# Patient Record
Sex: Female | Born: 1990 | Race: White | Hispanic: No | Marital: Single | Smoking: Current some day smoker
Health system: Southern US, Community
[De-identification: ages and names within clinical notes are randomized; demographics above are authoritative.]

## PROBLEM LIST (undated history)

## (undated) DIAGNOSIS — F329 Major depressive disorder, single episode, unspecified: Secondary | ICD-10-CM

## (undated) DIAGNOSIS — F1910 Other psychoactive substance abuse, uncomplicated: Secondary | ICD-10-CM

## (undated) DIAGNOSIS — D649 Anemia, unspecified: Secondary | ICD-10-CM

## (undated) DIAGNOSIS — F32A Depression, unspecified: Secondary | ICD-10-CM

## (undated) DIAGNOSIS — T7840XA Allergy, unspecified, initial encounter: Secondary | ICD-10-CM

## (undated) DIAGNOSIS — S62101A Fracture of unspecified carpal bone, right wrist, initial encounter for closed fracture: Secondary | ICD-10-CM

## (undated) DIAGNOSIS — F419 Anxiety disorder, unspecified: Secondary | ICD-10-CM

## (undated) DIAGNOSIS — J45909 Unspecified asthma, uncomplicated: Secondary | ICD-10-CM

## (undated) HISTORY — DX: Major depressive disorder, single episode, unspecified: F32.9

## (undated) HISTORY — DX: Depression, unspecified: F32.A

## (undated) HISTORY — PX: OTHER SURGICAL HISTORY: SHX169

## (undated) HISTORY — DX: Unspecified asthma, uncomplicated: J45.909

## (undated) HISTORY — DX: Anxiety disorder, unspecified: F41.9

## (undated) HISTORY — DX: Anemia, unspecified: D64.9

## (undated) HISTORY — DX: Other psychoactive substance abuse, uncomplicated: F19.10

## (undated) HISTORY — DX: Fracture of unspecified carpal bone, right wrist, initial encounter for closed fracture: S62.101A

## (undated) HISTORY — DX: Allergy, unspecified, initial encounter: T78.40XA

---

## 2009-04-24 ENCOUNTER — Emergency Department (HOSPITAL_COMMUNITY): Admission: EM | Admit: 2009-04-24 | Discharge: 2009-04-24 | Payer: Self-pay | Admitting: Emergency Medicine

## 2010-11-13 ENCOUNTER — Other Ambulatory Visit (HOSPITAL_COMMUNITY)
Admission: RE | Admit: 2010-11-13 | Discharge: 2010-11-13 | Disposition: A | Discharge status: Home / Self Care | Payer: Managed Care, Other (non HMO) | Source: Ambulatory Visit | Attending: Family Medicine | Admitting: Family Medicine

## 2010-11-13 ENCOUNTER — Other Ambulatory Visit: Payer: Self-pay | Admitting: Physician Assistant

## 2010-11-13 DIAGNOSIS — Z01419 Encounter for gynecological examination (general) (routine) without abnormal findings: Secondary | ICD-10-CM | POA: Insufficient documentation

## 2011-10-23 ENCOUNTER — Ambulatory Visit: Payer: Managed Care, Other (non HMO) | Admitting: Family Medicine

## 2011-10-23 VITALS — BP 98/62 | HR 72 | Temp 98.7°F | Resp 16 | Ht 67.0 in | Wt 135.2 lb

## 2011-10-23 DIAGNOSIS — J45909 Unspecified asthma, uncomplicated: Secondary | ICD-10-CM

## 2011-10-23 MED ORDER — ALBUTEROL SULFATE HFA 108 (90 BASE) MCG/ACT IN AERS: 2.0000 | INHALATION_SPRAY | Freq: Four times a day (QID) | RESPIRATORY_TRACT | Status: DC | PRN

## 2011-10-23 NOTE — Progress Notes (Signed)
  Subjective:    Patient ID: Cindy Hodges, female    DOB: [birth year], 21 y.o.   MRN: [id number]  HPI Cindy Hodges is a 21 y.o. female Hx of asthma.  Last ov here 09/06/10.  PFT's last ov - FVC: 104%, FEV1 -97%  Only on albuterol - last used 2 days ago.  EIB, with hx childhood.  Usually before exercise and around cats. Replaces about once per year.   Triggers: cats, exercise/running.   Review of Systems  Constitutional: Negative for fever and chills.  Respiratory: Negative for cough and chest tightness.   Cardiovascular: Positive for palpitations (episodic with proventil. ).       Objective:   Physical Exam  Constitutional: She is oriented to person, place, and time. She appears well-developed and well-nourished.  HENT:  Head: Normocephalic and atraumatic.  Right Ear: External ear normal.  Left Ear: External ear normal.  Eyes: EOM are normal. Pupils are equal, round, and reactive to light.  Neck: Normal range of motion.  Cardiovascular: Normal rate, regular rhythm, normal heart sounds and intact distal pulses.   Pulmonary/Chest: Effort normal and breath sounds normal. No respiratory distress. She has no wheezes. She has no rales. She exhibits no tenderness.  Neurological: She is alert and oriented to person, place, and time.  Skin: Skin is warm and dry.  Psychiatric: She has a normal mood and affect. Her behavior is normal.        Assessment & Plan:  Cindy Hodges is a 21 y.o. female Asthma - extrinsic.  Overall controlled on episodic use of albuterol.  Trigger avoidance discussed, including allegra or zyrtec if going to be around cats.  Consider Immunocap if other triggers to be identified, or decrease in control. #1 albuterol and 1 RF.

## 2011-10-23 NOTE — Patient Instructions (Signed)
To look up more info on your condition, go to the website [email or web address], then on patient resources - select UPTODATE. Under patient resources, select asthma Return to the clinic or go to the nearest emergency room if any of your symptoms worsen or new symptoms occur.

## 2011-11-22 ENCOUNTER — Ambulatory Visit: Payer: Managed Care, Other (non HMO) | Admitting: Emergency Medicine

## 2011-11-22 ENCOUNTER — Ambulatory Visit: Payer: Managed Care, Other (non HMO)

## 2011-11-22 VITALS — BP 115/76 | HR 62 | Temp 98.0°F | Resp 16 | Ht 66.25 in | Wt 132.4 lb

## 2011-11-22 DIAGNOSIS — M25539 Pain in unspecified wrist: Secondary | ICD-10-CM

## 2011-11-22 DIAGNOSIS — S52309A Unspecified fracture of shaft of unspecified radius, initial encounter for closed fracture: Secondary | ICD-10-CM

## 2011-11-22 NOTE — Patient Instructions (Addendum)
Wrist Fracture  Your caregiver has diagnosed you as having a fracture of the wrist. A fracture is a break in the bone or bones. A cast or splint is used to protect and keep your injured bone(s) from moving. The cast or splint will usually be on for about 5 to 6 weeks. One of the bones of the wrist (the navicular bone) often does not show up as a fracture on X-ray until later or in the healing phase. With this bone your caregiver will often cast as though it is fractured even if not seen on the X-ray.  HOME CARE INSTRUCTIONS   · To lessen the swelling, keep the injured part elevated while sitting or lying down. Keeping the injury above the level of your heart (the center of the chest) will decrease swelling and pain.  · Do not wear rings or jewelry on the injured hand or wrist.  · Apply ice to the injury for 15 to 20 minutes, 3 to 4 times per day while awake for 2 days. Put the ice in a plastic bag and place a thin towel between the bag of ice and your cast.  · If you have a plaster or fiberglass cast:  · Do not try to scratch the skin under the cast using sharp or pointed objects.  · Check the skin around the cast every day. You may put lotion on any red or sore areas.  · Keep your cast dry and clean.  · If you have a plaster splint:  · Wear the splint as directed.  · You may loosen the elastic around the splint if your fingers become numb, tingle, or turn cold or blue.  · If you have been put in a removable splint, wear and use as directed.  · Do not use powders or deodorants in or around the cast or splint.  · Do not remove padding from your cast or splint.  · Do not put pressure on any part of your cast or splint. It may break. Rest your cast or splint only on a pillow the first 24 hours until it is fully hardened.  · Gently move your fingers often, so they do not get stiff.  · Do not remove the splint unless directed by your caregiver. Casts must be removed by an orthopedist.  · Your cast or splint can be  protected during bathing with a plastic bag. Do not lower the cast or splint into water.  · Only take over-the-counter or prescription medicines for pain, discomfort, or fever as directed by your caregiver.  · Follow up with your caregiver as directed.  SEEK IMMEDIATE MEDICAL CARE IF:   · Your cast or splint gets damaged or breaks.  · Your cast or splint feels too tight or loose.  · You have increased pain, not controlled with medication.  · You have increased swelling.  · Your skin or nails below the injury turn blue or grey or feel cold or numb.  · You have trouble moving or feeling your fingers.  · You experience any burning or stinging from the cast or splint.  · There is a bad smell coming from under the cast or splint.  · New stains or fluids are coming from under the cast or splint.  · You have any new injuries while wearing the cast or splint.  Document Released: 01/01/2005 Document Revised: 03/13/2011 Document Reviewed: 10/21/2006  ExitCare® Patient Information ©2012 [employer].

## 2011-11-22 NOTE — Progress Notes (Signed)
  Subjective:    Patient ID: Cindy Hodges, female    DOB: [birth year], 21 y.o.   MRN: [id number]  HPI patient was in her usual state of health until approximately 1 AM when she fell backwards on an outstretched right wrist. She now has significant pain and swelling in her wrist. She states there is no possibility that she is pregnant. She did not suffer any other injury    Review of Systems     Objective:   Physical Exam there is significant swelling over the radial side of the wrist. There is significant swelling over the navicular bone.   [facility] reading (PRIMARY) by  Dr.Bayler Nehring there is a suspected impacted fracture of the radial styloid.       Assessment & Plan:  Probable impacted fracture of the radial styloid. She was placed in a thumb spica splint. We'll rex-ray in 10 days to

## 2011-11-24 ENCOUNTER — Telehealth: Payer: Self-pay | Admitting: Radiology

## 2011-11-24 NOTE — Telephone Encounter (Signed)
Please call patient had another radiologist as not convinced there is a fracture there. She needs to continue to wear the splint and repeat x-rays in 10 days as instructed      I have called mom and she states the bill for office visit was $1000, I wanted to discuss this with you, she is asking about this fee, do you think she was charged a fx code? If she was do you think she will be covered under [employer].

## 2011-11-25 NOTE — Telephone Encounter (Signed)
Please call patient later noted that if there's not a fracture there she will not be charged a fee for the fracture. We will not know for sure until the followup films are done. If there is no fracture present she will not get any fracture charge. She does need to have followup x-rays.

## 2011-11-25 NOTE — Telephone Encounter (Signed)
I have called her mother to advise.

## 2011-12-13 ENCOUNTER — Ambulatory Visit (INDEPENDENT_AMBULATORY_CARE_PROVIDER_SITE_OTHER): Payer: Managed Care, Other (non HMO) | Admitting: Emergency Medicine

## 2011-12-13 ENCOUNTER — Ambulatory Visit: Payer: Managed Care, Other (non HMO)

## 2011-12-13 VITALS — BP 100/70 | HR 70 | Temp 98.6°F | Resp 16 | Ht 66.0 in | Wt 129.0 lb

## 2011-12-13 DIAGNOSIS — M25539 Pain in unspecified wrist: Secondary | ICD-10-CM

## 2011-12-13 DIAGNOSIS — S63509A Unspecified sprain of unspecified wrist, initial encounter: Secondary | ICD-10-CM

## 2011-12-13 NOTE — Progress Notes (Signed)
  Subjective:    Patient ID: Cindy Hodges, female    DOB: 27-May-1990, 21 y.o.   MRN: [id number]  HPI    Review of Systems     Objective:   Physical Exam        Assessment & Plan:

## 2011-12-13 NOTE — Patient Instructions (Signed)
Otitis Media, Adult  A middle ear infection is an infection in the space behind the eardrum. The medical name for this is "otitis media." It may happen after a common cold. It is caused by a germ that starts growing in that space. You may feel swollen glands in your neck on the side of the ear infection.  HOME CARE INSTRUCTIONS   · Take your medicine as directed until it is gone, even if you feel better after the first few days.  · Only take over-the-counter or prescription medicines for pain, discomfort, or fever as directed by your caregiver.  · Occasional use of a nasal decongestant a couple times per day may help with discomfort and help the eustachian tube to drain better.  Follow up with your caregiver in 10 to 14 days or as directed, to be certain that the infection has cleared. Not keeping the appointment could result in a chronic or permanent injury, pain, hearing loss and disability. If there is any problem keeping the appointment, you must call back to this facility for assistance.  SEEK IMMEDIATE MEDICAL CARE IF:   · You are not getting better in 2 to 3 days.  · You have pain that is not controlled with medication.  · You feel worse instead of better.  · You cannot use the medication as directed.  · You develop swelling, redness or pain around the ear or stiffness in your neck.  MAKE SURE YOU:   · Understand these instructions.  · Will watch your condition.  · Will get help right away if you are not doing well or get worse.  Document Released: 12/28/2003 Document Revised: 03/13/2011 Document Reviewed: 10/29/2007  ExitCare® Patient Information ©2012 [employer].

## 2011-12-13 NOTE — Progress Notes (Signed)
  Subjective:    Patient ID: Cindy Hodges, female    DOB: 1990/08/01, 21 y.o.   MRN: [id number]  HPI patient enters for recheck of her wrist. Her wrist is doing very well she has very minimal pain    Review of Systems     Objective:   Physical Exam there is no tenderness over the wrist there is some limited range of motion but I feel this is secondary to her being immobilized for the last week  [facility] reading (PRIMARY) by  Dr. Cleta Alberts actually  No fracture        Assessment & Plan:  She was wrapped in an Ace wrap she is released to return to work regular duty. She was told about doing some exercises to help with range of motion

## 2012-01-03 ENCOUNTER — Ambulatory Visit (INDEPENDENT_AMBULATORY_CARE_PROVIDER_SITE_OTHER): Payer: Managed Care, Other (non HMO) | Admitting: Physician Assistant

## 2012-01-03 VITALS — BP 105/65 | HR 77 | Temp 98.6°F | Resp 16 | Ht 67.0 in | Wt 131.0 lb

## 2012-01-03 DIAGNOSIS — R05 Cough: Secondary | ICD-10-CM

## 2012-01-03 DIAGNOSIS — J45909 Unspecified asthma, uncomplicated: Secondary | ICD-10-CM

## 2012-01-03 DIAGNOSIS — R062 Wheezing: Secondary | ICD-10-CM

## 2012-01-03 LAB — POCT CBC
MCH, POC: 30.7 pg (ref 27–31.2)
MCHC: 31.9 g/dL (ref 31.8–35.4)
MCV: 96.4 fL (ref 80–97)
MID (cbc): 0.7 (ref 0–0.9)
POC LYMPH PERCENT: 16.2 %L (ref 10–50)
POC MID %: 7.9 %M (ref 0–12)
Platelet Count, POC: 235 10*3/uL (ref 142–424)
RDW, POC: 12.9 %
WBC: 8.9 10*3/uL (ref 4.6–10.2)

## 2012-01-03 MED ORDER — ALBUTEROL SULFATE (2.5 MG/3ML) 0.083% IN NEBU
2.5000 mg | INHALATION_SOLUTION | Freq: Once | RESPIRATORY_TRACT | Status: AC
  Administered 2012-01-03: 2.5 mg via RESPIRATORY_TRACT

## 2012-01-03 MED ORDER — HYDROCODONE-HOMATROPINE 5-1.5 MG/5ML PO SYRP: ORAL_SOLUTION | ORAL | Status: DC

## 2012-01-03 MED ORDER — AZITHROMYCIN 500 MG PO TABS: 500.0000 mg | ORAL_TABLET | Freq: Every day | ORAL | Status: DC

## 2012-01-03 MED ORDER — PREDNISONE 20 MG PO TABS: ORAL_TABLET | ORAL | Status: DC

## 2012-01-03 NOTE — Progress Notes (Signed)
Patient ID: Cindy Hodges MRN: [id number], DOB: July 09, 1990, 21 y.o. Date of Encounter: 01/03/2012, 5:50 PM  Primary Physician: Tally Due, MD  Chief Complaint:  Chief Complaint  Patient presents with  . Sore Throat    x 5 days  . Nasal Congestion    x 5 days  . Cough    x 5 days    HPI: 21 y.o. year old female presents with a five day history of nasal congestion, post nasal drip, sore throat, and cough. Mild sinus pressure. Afebrile. No chills. Nasal congestion thick and green/yellow. Cough is productive of green/yellow sputum and worse during the day. She has noticed that her asthma has worsened over the course of her above illness. She is now using her albuterol inhaler 1-2 times daily, when she typically uses it 1 time every 2 weeks. She does note some wheezing and shortness of breath with her cough. Ears feel full, leading to sensation of muffled hearing during the past couple of days. Has tried OTC cold preps without success. No GI complaints. Appetite normal. Works in [employer]. She does smoke tobacco occassionally.   No sick contacts, recent antibiotics, or recent travels.   No leg trauma, sedentary periods, or h/o cancer.  Asthma typically very well controlled. No issues with wrist.  Past Medical History  Diagnosis Date  . Asthma   . Allergy      Home Meds: Prior to Admission medications   Medication Sig Start Date End Date Taking? Authorizing Provider  albuterol (PROVENTIL HFA) 108 (90 BASE) MCG/ACT inhaler Inhale 2 puffs into the lungs every 6 (six) hours as needed for wheezing or shortness of breath. 10/23/11  Yes Shade Flood, MD    Allergies: No Known Allergies  History   Social History  . Marital Status: Single    Spouse Name: N/A    Number of Children: N/A  . Years of Education: N/A   Occupational History  . Not on file.   Social History Main Topics  . Smoking status: Current Some Day Smoker -- 4 years    Types: Cigarettes  .  Smokeless tobacco: Not on file  . Alcohol Use: Not on file  . Drug Use: Not on file  . Sexually Active: Not on file   Other Topics Concern  . Not on file   Social History Narrative  . No narrative on file     Review of Systems: Constitutional: negative for chills, fever, night sweats or weight changes Cardiovascular: negative for chest pain or palpitations Respiratory: negative for hemoptysis Abdominal: negative for abdominal pain, nausea, vomiting or diarrhea Dermatological: negative for rash Neurologic: negative for headache   Physical Exam: Blood pressure 105/65, pulse 77, temperature 98.6 F (37 C), resp. rate 16, height 5\' 7"  (1.702 m), weight 131 lb (59.421 kg), last menstrual period 12/03/2011, SpO2 98.00%., Body mass index is 20.52 kg/(m^2). General: Well developed, well nourished, in no acute distress. Head: Normocephalic, atraumatic, eyes without discharge, sclera non-icteric, nares are congested. Bilateral auditory canals clear, TM's are without perforation, pearly grey with reflective cone of light bilaterally. No sinus TTP. Oral cavity moist, dentition normal. Posterior pharynx with post nasal drip and mild erythema. No peritonsillar abscess or tonsillar exudate. Neck: Supple. No thyromegaly. Full ROM. No lymphadenopathy. Lungs: Mild expiratory wheezing bilaterally without rales or rhonchi. Breathing is unlabored. Status post albuterol neb: patient feels that her breathing is better. No further wheezing to auscultation.  Heart: RRR with S1 S2. No murmurs, rubs,  or gallops appreciated. Msk:  Strength and tone normal for age. Extremities: No clubbing or cyanosis. No edema. Neuro: Alert and oriented X 3. Moves all extremities spontaneously. CNII-XII grossly in tact. Psych:  Responds to questions appropriately with a normal affect.   Labs: Results for orders placed in visit on 01/03/12  POCT CBC      Component Value Range   WBC 8.9  4.6 - 10.2 K/uL   Lymph, poc 1.4   0.6 - 3.4   POC LYMPH PERCENT 16.2  10 - 50 %L   MID (cbc) 0.7  0 - 0.9   POC MID % 7.9  0 - 12 %M   POC Granulocyte 6.8  2 - 6.9   Granulocyte percent 75.9  37 - 80 %G   RBC 4.59  4.04 - 5.48 M/uL   Hemoglobin 14.1  12.2 - 16.2 g/dL   HCT, POC 19.1  47.8 - 47.9 %   MCV 96.4  80 - 97 fL   MCH, POC 30.7  27 - 31.2 pg   MCHC 31.9  31.8 - 35.4 g/dL   RDW, POC 29.5     Platelet Count, POC 235  142 - 424 K/uL   MPV 8.1  0 - 99.8 fL     ASSESSMENT AND PLAN:  21 y.o. year old female with asthmatic bronchitis, wheezing, coughing, and tobacco abuse.  -Albuterol nebulizer in office -Azithromycin 500 mg 1 po daily #5 no RF -Prednisone 20 mg #18 3x3, 2x3, 1x3 no RF -Hycodan #4oz 1 tsp po q 4-6 hours prn cough no RF SED -Mucinex -Tylenol/Motrin prn -Must stop smoking tobacco, she is an asthmatic -Does not need a refill of her albuterol inhaler -OOW today and 01/04/12 -Rest/fluids -RTC precautions -RTC 3-5 days if no improvement  Signed, Eula Listen, PA-C 01/03/2012 5:50 PM

## 2012-04-12 ENCOUNTER — Telehealth: Payer: Self-pay

## 2012-04-12 ENCOUNTER — Ambulatory Visit: Payer: Managed Care, Other (non HMO)

## 2012-04-12 NOTE — Telephone Encounter (Signed)
pts mother called and states daughter was seen on 11/22/11 and was billed a fracture of the wrist, but received a call later from our office indicating there was not a fracture, it was only a sprain.  We have billed as fracture, does this need to be corrected?

## 2012-04-12 NOTE — Telephone Encounter (Signed)
Does this matter?

## 2012-04-12 NOTE — Telephone Encounter (Signed)
I believe so, and then the follow-up visit would be billed as a 99213 (I think), rather than 99024.

## 2012-04-13 NOTE — Telephone Encounter (Signed)
Per note from [place], looks like August charges need to be changed. Forwarding to [employer])

## 2012-06-16 ENCOUNTER — Encounter (HOSPITAL_COMMUNITY): Payer: Self-pay | Admitting: *Deleted

## 2012-06-16 ENCOUNTER — Emergency Department (HOSPITAL_COMMUNITY)
Admission: EM | Admit: 2012-06-16 | Discharge: 2012-06-16 | Disposition: A | Discharge status: Home / Self Care | Payer: Managed Care, Other (non HMO) | Attending: Emergency Medicine | Admitting: Emergency Medicine

## 2012-06-16 DIAGNOSIS — Y9239 Other specified sports and athletic area as the place of occurrence of the external cause: Secondary | ICD-10-CM | POA: Insufficient documentation

## 2012-06-16 DIAGNOSIS — Z8659 Personal history of other mental and behavioral disorders: Secondary | ICD-10-CM | POA: Insufficient documentation

## 2012-06-16 DIAGNOSIS — Y92838 Other recreation area as the place of occurrence of the external cause: Secondary | ICD-10-CM | POA: Insufficient documentation

## 2012-06-16 DIAGNOSIS — IMO0002 Reserved for concepts with insufficient information to code with codable children: Secondary | ICD-10-CM

## 2012-06-16 DIAGNOSIS — W1809XA Striking against other object with subsequent fall, initial encounter: Secondary | ICD-10-CM | POA: Insufficient documentation

## 2012-06-16 DIAGNOSIS — S058X9A Other injuries of unspecified eye and orbit, initial encounter: Secondary | ICD-10-CM | POA: Insufficient documentation

## 2012-06-16 DIAGNOSIS — Z79899 Other long term (current) drug therapy: Secondary | ICD-10-CM | POA: Insufficient documentation

## 2012-06-16 DIAGNOSIS — F17200 Nicotine dependence, unspecified, uncomplicated: Secondary | ICD-10-CM | POA: Insufficient documentation

## 2012-06-16 DIAGNOSIS — J45909 Unspecified asthma, uncomplicated: Secondary | ICD-10-CM | POA: Insufficient documentation

## 2012-06-16 DIAGNOSIS — Z23 Encounter for immunization: Secondary | ICD-10-CM | POA: Insufficient documentation

## 2012-06-16 DIAGNOSIS — Y9331 Activity, mountain climbing, rock climbing and wall climbing: Secondary | ICD-10-CM | POA: Insufficient documentation

## 2012-06-16 DIAGNOSIS — Z862 Personal history of diseases of the blood and blood-forming organs and certain disorders involving the immune mechanism: Secondary | ICD-10-CM | POA: Insufficient documentation

## 2012-06-16 MED ORDER — TETANUS-DIPHTH-ACELL PERTUSSIS 5-2.5-18.5 LF-MCG/0.5 IM SUSP
0.5000 mL | Freq: Once | INTRAMUSCULAR | Status: AC
  Administered 2012-06-16: 0.5 mL via INTRAMUSCULAR
  Filled 2012-06-16: qty 0.5

## 2012-06-16 MED ORDER — CEPHALEXIN 500 MG PO CAPS: 500.0000 mg | ORAL_CAPSULE | Freq: Four times a day (QID) | ORAL | Status: DC

## 2012-06-16 MED ORDER — TETRACAINE HCL 0.5 % OP SOLN
2.0000 [drp] | Freq: Once | OPHTHALMIC | Status: DC
  Filled 2012-06-16: qty 2

## 2012-06-16 MED ORDER — FLUORESCEIN SODIUM 1 MG OP STRP
1.0000 | ORAL_STRIP | Freq: Once | OPHTHALMIC | Status: DC
  Filled 2012-06-16 (×2): qty 1

## 2012-06-16 NOTE — ED Provider Notes (Signed)
History     CSN: [id number]  Arrival date & time 06/16/12  1059   First MD Initiated Contact with Patient 06/16/12 1129      Chief Complaint  Patient presents with  . Eye Injury    (Consider location/radiation/quality/duration/timing/severity/associated sxs/prior treatment) HPI Comments: This is a 22 year old female, no pertinent past medical history, who presents emergency department with chief complaint of right eye lid laceration. Patient states that she fell and hit her face on a rock while camping. She states that her pain is mild to moderate at rest. She has not tried anything to alleviate her symptoms. She denies any loss of consciousness. She states that nothing makes her symptoms better or worse.  The history is provided by the patient. No language interpreter was used.    Past Medical History  Diagnosis Date  . Asthma   . Allergy   . Anemia   . Anxiety   . Depression   . Substance abuse     History reviewed. No pertinent past surgical history.  Family History  Problem Relation Age of Onset  . Diabetes Mother     History  Substance Use Topics  . Smoking status: Current Some Day Smoker -- 0.50 packs/day for 4 years    Types: Cigarettes  . Smokeless tobacco: Not on file  . Alcohol Use: 0.0 oz/week    2-3 Cans of beer per week     Comment: 4-7 days a week    OB History   Grav Para Term Preterm Abortions TAB SAB Ect Mult Living                  Review of Systems  All other systems reviewed and are negative.    Allergies  Review of patient's allergies indicates no known allergies.  Home Medications   Current Outpatient Rx  Name  Route  Sig  Dispense  Refill  . albuterol (PROVENTIL HFA) 108 (90 BASE) MCG/ACT inhaler   Inhalation   Inhale 2 puffs into the lungs every 6 (six) hours as needed for wheezing or shortness of breath.   18 g   1   . cephALEXin (KEFLEX) 500 MG capsule   Oral   Take 1 capsule (500 mg total) by mouth 4 (four) times  daily.   40 capsule   0     BP 110/69  Temp(Src) 98.7 F (37.1 C) (Oral)  Resp 18  SpO2 100%  Physical Exam  Nursing note and vitals reviewed. Constitutional: She is oriented to person, place, and time. She appears well-developed and well-nourished.  HENT:  Head: Normocephalic and atraumatic.  No bony tenderness to palpation over the orbits bilaterally, no facial bone tenderness to palpation  Eyes: Conjunctivae and EOM are normal. Pupils are equal, round, and reactive to light.  Right eye does not have any abrasions or lacerations, as witnessed with Wood's lamp examination, no visible foreign bodies, her vision is 20/30 bilaterally, 20/30 in the left, 20/50 in right, no pain with eye movement, no signs of orbital fracture or hyphema  Neck: Normal range of motion. Neck supple.  Cardiovascular: Normal rate and regular rhythm.  Exam reveals no gallop and no friction rub.   No murmur heard. Pulmonary/Chest: Effort normal and breath sounds normal. No respiratory distress. She has no wheezes. She has no rales. She exhibits no tenderness.  Abdominal: Soft. Bowel sounds are normal. She exhibits no distension and no mass. There is no tenderness. There is no rebound and no guarding.  Musculoskeletal: Normal range of motion. She exhibits no edema and no tenderness.  Neurological: She is alert and oriented to person, place, and time.  Skin: Skin is warm and dry.  Laceration to the right lower eyelid, moderate swelling below the right eye  Psychiatric: She has a normal mood and affect. Her behavior is normal. Judgment and thought content normal.    ED Course  Procedures (including critical care time)  Labs Reviewed - No data to display No results found.  LACERATION REPAIR Performed by: Roxy Horseman Authorized by: Roxy Horseman Consent: Verbal consent obtained. Risks and benefits: risks, benefits and alternatives were discussed Consent given by: patient Patient identity  confirmed: provided demographic data Prepped and Draped in normal sterile fashion Wound explored  Laceration Location: Right lower eyelid Laceration Length: 4 cm  No Foreign Bodies seen or palpated  Anesthesia: local infiltration  Local anesthetic: lidocaine 2 % without epinephrine  Anesthetic total: 2 ml  Irrigation method: syringe Amount of cleaning: standard  Skin closure: 5-0 monofilament   Number of sutures: 4  Technique: Simple interrupted   Tetanus updated Patient tolerance: Patient tolerated the procedure well with no immediate complications.  1. Laceration       MDM   This is a 22 year old female with right eyelid laceration, no tenderness over the facial bones, no bony tenderness or obvious deformities, doubt any involvement of the orbit, patient is able to move eyes without pain, vision is appropriate, as patient tells me that she has bad eyes any way, however her vision was pretty good here 20/30 bilaterally, 20/30 left, 20/50 right , suspect a right was worsened secondary to swelling, no evidence of corneal abrasions or lacerations, no foreign bodies. I repaired the eyelid laceration, and will have the patient followup in 5 days for suture removal.       Roxy Horseman, PA-C 06/16/12 1555

## 2012-06-16 NOTE — ED Notes (Signed)
Pt reports falling into a fire pit last night.  States that she scratched her (R) eye when she did.  Denies LOC.  Pt noted to have a swollen eye, drainage noted from eye.  Pt does report blurred vision-unsure if it is from tears or from injury.

## 2012-06-16 NOTE — ED Notes (Signed)
Right eye is red and swollen with a moderate amt of clear/yellow drainage. Pt can see out of eye, states vision is blurry. Pupils are reactive, brisk, and round.

## 2012-06-18 NOTE — ED Provider Notes (Signed)
Medical screening examination/treatment/procedure(s) were performed by non-physician practitioner and as supervising physician I was immediately available for consultation/collaboration.   Alan Davidson III, MD 06/18/12 1129 

## 2012-06-23 ENCOUNTER — Encounter (HOSPITAL_COMMUNITY): Payer: Self-pay | Admitting: Emergency Medicine

## 2012-06-23 ENCOUNTER — Emergency Department (HOSPITAL_COMMUNITY)
Admission: EM | Admit: 2012-06-23 | Discharge: 2012-06-23 | Disposition: A | Discharge status: Home / Self Care | Payer: Managed Care, Other (non HMO) | Attending: Emergency Medicine | Admitting: Emergency Medicine

## 2012-06-23 DIAGNOSIS — J45909 Unspecified asthma, uncomplicated: Secondary | ICD-10-CM | POA: Insufficient documentation

## 2012-06-23 DIAGNOSIS — Z862 Personal history of diseases of the blood and blood-forming organs and certain disorders involving the immune mechanism: Secondary | ICD-10-CM | POA: Insufficient documentation

## 2012-06-23 DIAGNOSIS — S0181XA Laceration without foreign body of other part of head, initial encounter: Secondary | ICD-10-CM

## 2012-06-23 DIAGNOSIS — Z4802 Encounter for removal of sutures: Secondary | ICD-10-CM

## 2012-06-23 DIAGNOSIS — F17200 Nicotine dependence, unspecified, uncomplicated: Secondary | ICD-10-CM | POA: Insufficient documentation

## 2012-06-23 DIAGNOSIS — Z79899 Other long term (current) drug therapy: Secondary | ICD-10-CM | POA: Insufficient documentation

## 2012-06-23 DIAGNOSIS — Z8659 Personal history of other mental and behavioral disorders: Secondary | ICD-10-CM | POA: Insufficient documentation

## 2012-06-23 NOTE — ED Notes (Addendum)
Pt here for suture removal- around R eye.  No complaints.  States sutures have been in for 1 week.

## 2012-06-23 NOTE — ED Provider Notes (Signed)
History     CSN: [id number]  Arrival date & time 06/23/12  1737   First MD Initiated Contact with Patient 06/23/12 1909      Chief Complaint  Patient presents with  . Suture / Staple Removal    (Consider location/radiation/quality/duration/timing/severity/associated sxs/prior treatment) Patient is a 22 y.o. female presenting with suture removal. The history is provided by the patient.  Suture / Staple Removal  The sutures were placed 7 to 10 days ago. Treatments since wound repair include oral antibiotics. Fever duration: none. There has been no drainage from the wound. There is no redness present. There is no swelling present. The pain has no pain. Difficulty Moving Extremity/Digit: none.    Past Medical History  Diagnosis Date  . Asthma   . Allergy   . Anemia   . Anxiety   . Depression   . Substance abuse     History reviewed. No pertinent past surgical history.  Family History  Problem Relation Age of Onset  . Diabetes Mother     History  Substance Use Topics  . Smoking status: Current Some Day Smoker -- 0.50 packs/day for 4 years    Types: Cigarettes  . Smokeless tobacco: Not on file  . Alcohol Use: 0.0 oz/week    2-3 Cans of beer per week     Comment: 4-7 days a week    OB History   Grav Para Term Preterm Abortions TAB SAB Ect Mult Living                  Review of Systems  All other systems reviewed and are negative.    Allergies  Review of patient's allergies indicates no known allergies.  Home Medications   Current Outpatient Rx  Name  Route  Sig  Dispense  Refill  . albuterol (PROVENTIL HFA) 108 (90 BASE) MCG/ACT inhaler   Inhalation   Inhale 2 puffs into the lungs every 6 (six) hours as needed for wheezing or shortness of breath.   18 g   1   . cephALEXin (KEFLEX) 500 MG capsule   Oral   Take 1 capsule (500 mg total) by mouth 4 (four) times daily.   40 capsule   0     BP 114/67  Pulse 67  Temp(Src) 98.1 F (36.7 C) (Oral)   Resp 16  SpO2 100%  LMP 06/16/2012  Physical Exam  Constitutional: She is oriented to person, place, and time. She appears well-developed and well-nourished.  HENT:  Head: Normocephalic.  Right Ear: External ear normal.  Left Ear: External ear normal.  Nose: Nose normal.  Mouth/Throat: Oropharynx is clear and moist.  Eyes: EOM are normal. Pupils are equal, round, and reactive to light. Right eye exhibits no discharge. Left eye exhibits no discharge.  Well-healed laceration without induration fluctuance or tenderness located inferior lateral  To orbit and inferior to orbit  Neck: Normal range of motion. Neck supple. No tracheal deviation present.  No nuchal rigidity no meningeal signs  Cardiovascular: Normal rate and regular rhythm.   Pulmonary/Chest: Effort normal and breath sounds normal. No stridor. No respiratory distress. She has no wheezes. She has no rales.  Abdominal: Soft. She exhibits no distension and no mass. There is no tenderness. There is no rebound and no guarding.  Musculoskeletal: Normal range of motion. She exhibits no edema and no tenderness.  Neurological: She is alert and oriented to person, place, and time. She has normal reflexes. No cranial nerve deficit. Coordination normal.  Skin: Skin is warm. No rash noted. She is not diaphoretic. No erythema. No pallor.  No pettechia no purpura    ED Course  Procedures (including critical care time)  Labs Reviewed - No data to display No results found.   1. Visit for suture removal   2. Laceration of periorbital area, initial encounter       MDM  Suture repair per note without issue. No history of fever induration fluctuance tenderness drainage or streaking erythema to suggest infection I will discharge home with supportive care family and patient agree with plan     SUTURE REMOVAL Performed by: Arley Phenix  Consent: Verbal consent obtained. Patient identity confirmed: provided demographic data Time  out: Immediately prior to procedure a "time out" was called to verify the correct patient, procedure, equipment, support staff and site/side marked as required.  Location details: periorbital area  Wound Appearance: clean  Sutures/Staples Removed: 4  Facility: sutures placed in this facility Patient tolerance: Patient tolerated the procedure well with no immediate complications.    Arley Phenix, MD 06/23/12 [facility] [place]

## 2012-12-27 ENCOUNTER — Ambulatory Visit (INDEPENDENT_AMBULATORY_CARE_PROVIDER_SITE_OTHER): Payer: Managed Care, Other (non HMO) | Admitting: Family Medicine

## 2012-12-27 ENCOUNTER — Ambulatory Visit: Payer: Managed Care, Other (non HMO)

## 2012-12-27 VITALS — BP 102/60 | HR 68 | Temp 98.7°F | Resp 18 | Ht 67.0 in | Wt 136.0 lb

## 2012-12-27 DIAGNOSIS — S93609A Unspecified sprain of unspecified foot, initial encounter: Secondary | ICD-10-CM

## 2012-12-27 DIAGNOSIS — M25572 Pain in left ankle and joints of left foot: Secondary | ICD-10-CM

## 2012-12-27 DIAGNOSIS — M25579 Pain in unspecified ankle and joints of unspecified foot: Secondary | ICD-10-CM

## 2012-12-27 DIAGNOSIS — S93602A Unspecified sprain of left foot, initial encounter: Secondary | ICD-10-CM

## 2012-12-27 NOTE — Patient Instructions (Addendum)
Recommend ice for 20 minutes 2-3 times daily Elevate at least once daily Tylenol or Motrin for pain Return in 1-2 weeks if symptoms fail to improve, sooner if they worsen.

## 2012-12-27 NOTE — Progress Notes (Signed)
  Subjective:    Patient ID: Cindy Hodges, female    DOB: [birth year], 22 y.o.   MRN: [id number]  HPI 22 year old female presents for evaluation of left foot pain s/p an injury on 12/25/12. States she was leaving a friends house and tripped over a ridge in the sidewalk. She is unsure of the exact mechanism of injury - possible inversion. Admits her ankle is ok but her foot has been intensely painful since the injury.  She has been using 1 crutch to ambulate - able to put weight on her heel but this still causes foot pain.  STS and bruising have decreased slightly.  Admits her toes do feel numb but she has no weakness or paresthesias.   She is a Consulting civil engineer at [employer]. Patient is healthy with no other concerns today.     Review of Systems  Musculoskeletal: Positive for back pain (secondary to pain) and joint swelling.  Skin: Positive for color change (bruising left foot). Negative for wound.  Neurological: Positive for numbness (toes). Negative for weakness.       Objective:   Physical Exam  Constitutional: She is oriented to person, place, and time. She appears well-developed and well-nourished.  HENT:  Head: Normocephalic and atraumatic.  Right Ear: External ear normal.  Left Ear: External ear normal.  Eyes: Conjunctivae are normal.  Neck: Normal range of motion.  Cardiovascular: Normal rate.   Pulmonary/Chest: Effort normal.  Musculoskeletal:       Left ankle: Tenderness. Head of 5th metatarsal tenderness found.  Patient has pain over proximal aspect of 3rd, 4th, and 5th metacarpals. Ecchymosis of MCP's.  Capillary refill <2 seconds.   Neurological: She is alert and oriented to person, place, and time.  Psychiatric: She has a normal mood and affect. Her behavior is normal. Judgment and thought content normal.     [facility] reading (PRIMARY) by  Dr. Alwyn Ren as no acute bony abnormalities.       Assessment & Plan:  Pain in joint, ankle and foot, left - Plan: DG Foot Complete Left  Sprain of  foot joint, left, initial encounter  Placed in short cam for comfort.  Recommend Tylenol or Motrin as needed for pain Ice for 20 minutes 2-3 times daily. Elevate as much as possible.  Return in 1-2 weeks if symptoms have not improved, sooner if worse

## 2012-12-27 NOTE — Progress Notes (Signed)
X-ray reviewed with the physician assistant. No bony abnormalities were found. Treatment plan was discussed and agreed upon.

## 2013-06-20 ENCOUNTER — Other Ambulatory Visit: Payer: Self-pay | Admitting: Family Medicine

## 2013-06-20 ENCOUNTER — Telehealth: Payer: Self-pay

## 2013-06-20 NOTE — Telephone Encounter (Signed)
Patient is requesting a refill for her Abterol Inhaler . Please call patient.     Thank You!!!

## 2013-06-20 NOTE — Telephone Encounter (Signed)
Sent in Rx w/note to RTC for more. Tried to call pt to notify but VM not set up.

## 2013-06-22 NOTE — Telephone Encounter (Signed)
Notified pt about RF and need for OV. Pt has gotten her RF and I transferred her to scheduling for appt.

## 2013-10-10 ENCOUNTER — Encounter (HOSPITAL_COMMUNITY): Payer: Self-pay | Admitting: Emergency Medicine

## 2013-10-10 ENCOUNTER — Emergency Department (HOSPITAL_COMMUNITY)
Admission: EM | Admit: 2013-10-10 | Discharge: 2013-10-10 | Disposition: A | Discharge status: Home / Self Care | Payer: Managed Care, Other (non HMO) | Attending: Emergency Medicine | Admitting: Emergency Medicine

## 2013-10-10 DIAGNOSIS — F17200 Nicotine dependence, unspecified, uncomplicated: Secondary | ICD-10-CM | POA: Insufficient documentation

## 2013-10-10 DIAGNOSIS — Z862 Personal history of diseases of the blood and blood-forming organs and certain disorders involving the immune mechanism: Secondary | ICD-10-CM | POA: Insufficient documentation

## 2013-10-10 DIAGNOSIS — J45901 Unspecified asthma with (acute) exacerbation: Secondary | ICD-10-CM | POA: Insufficient documentation

## 2013-10-10 DIAGNOSIS — Z792 Long term (current) use of antibiotics: Secondary | ICD-10-CM | POA: Insufficient documentation

## 2013-10-10 DIAGNOSIS — Z8659 Personal history of other mental and behavioral disorders: Secondary | ICD-10-CM | POA: Insufficient documentation

## 2013-10-10 MED ORDER — ALBUTEROL SULFATE HFA 108 (90 BASE) MCG/ACT IN AERS: 2.0000 | INHALATION_SPRAY | RESPIRATORY_TRACT | Status: DC | PRN

## 2013-10-10 MED ORDER — ALBUTEROL SULFATE HFA 108 (90 BASE) MCG/ACT IN AERS
2.0000 | INHALATION_SPRAY | Freq: Once | RESPIRATORY_TRACT | Status: AC
  Administered 2013-10-10: 2 via RESPIRATORY_TRACT
  Filled 2013-10-10: qty 6.7

## 2013-10-10 MED ORDER — PREDNISONE 20 MG PO TABS: 40.0000 mg | ORAL_TABLET | Freq: Every day | ORAL | Status: DC

## 2013-10-10 MED ORDER — IPRATROPIUM-ALBUTEROL 0.5-2.5 (3) MG/3ML IN SOLN
3.0000 mL | Freq: Once | RESPIRATORY_TRACT | Status: AC
  Administered 2013-10-10: 3 mL via RESPIRATORY_TRACT
  Filled 2013-10-10: qty 3

## 2013-10-10 MED ORDER — PREDNISONE 20 MG PO TABS
60.0000 mg | ORAL_TABLET | Freq: Once | ORAL | Status: AC
  Administered 2013-10-10: 60 mg via ORAL
  Filled 2013-10-10: qty 3

## 2013-10-10 NOTE — ED Provider Notes (Signed)
Medical screening examination/treatment/procedure(s) were performed by non-physician practitioner and as supervising physician I was immediately available for consultation/collaboration.   EKG Interpretation   Date/Time:  Monday October 10 2013 01:27:42 EDT Ventricular Rate:  72 PR Interval:  150 QRS Duration: 89 QT Interval:  381 QTC Calculation: 417 R Axis:   74 Text Interpretation:  Sinus rhythm Confirmed by Freida BusmanALLEN  MD, Mena Lienau ([id number])  on 10/10/2013 4:45:01 AM       Toy BakerAnthony T Suda Forbess, MD 10/10/13 [phone]

## 2013-10-10 NOTE — ED Provider Notes (Signed)
CSN: [id number]     Arrival date & time 10/10/13  0118 History   First MD Initiated Contact with Patient 10/10/13 0147     Chief Complaint  Patient presents with  . Asthma     (Consider location/radiation/quality/duration/timing/severity/associated sxs/prior Treatment) HPI Cindy Hodges is a 23 y.o. female who presents to ED with complaint of shortness of breath. Pt states she has hx of asthma. States has had wheezing and shortness of breath since yesterday. States she has been staying at her boy friends house who has cats and she is allergic to them. States she used an inhaler yesterday and it helped. States tonight she ran out of inhaler.  Reports persistent wheezing and chest tightness. States by the time she got to ED, her symptoms already improved. Currently no chest pain. Mild sob. Denies any cough, fever, chills, recent travel, recent surgeries, leg swelling.   Past Medical History  Diagnosis Date  . Asthma   . Allergy   . Anemia   . Anxiety   . Depression   . Substance abuse    Past Surgical History  Procedure Laterality Date  . Broken wrist     Family History  Problem Relation Age of Onset  . Diabetes Mother   . Stroke Maternal Grandmother    History  Substance Use Topics  . Smoking status: Current Some Day Smoker -- 0.50 packs/day for 4 years    Types: Cigarettes  . Smokeless tobacco: Not on file  . Alcohol Use: 0.0 oz/week    2-3 Cans of beer per week     Comment: 4-7 days a week   OB History   Grav Para Term Preterm Abortions TAB SAB Ect Mult Living                 Review of Systems  Constitutional: Negative for fever and chills.  Respiratory: Positive for chest tightness, shortness of breath and wheezing. Negative for cough.   Cardiovascular: Positive for chest pain. Negative for palpitations and leg swelling.  Gastrointestinal: Negative for nausea, vomiting, abdominal pain and diarrhea.  Genitourinary: Negative for dysuria, flank pain and pelvic pain.   Musculoskeletal: Negative for arthralgias and myalgias.  Skin: Negative for rash.  Neurological: Negative for dizziness, weakness and headaches.  All other systems reviewed and are negative.     Allergies  Other  Home Medications   Prior to Admission medications   Medication Sig Start Date End Date Taking? Authorizing Provider  albuterol (PROVENTIL HFA) 108 (90 BASE) MCG/ACT inhaler Inhale 2 puffs into the lungs every 4 (four) hours as needed. PATIENT NEEDS OFFICE VISIT FOR ADDITIONAL REFILLS 06/20/13   Shade FloodJeffrey R Greene, MD  cephALEXin (KEFLEX) 500 MG capsule Take 1 capsule (500 mg total) by mouth 4 (four) times daily. 06/16/12   Roxy Horsemanobert Browning, PA-C   BP 137/89  Pulse 77  Temp(Src) 98 F (36.7 C) (Oral)  Resp 20  SpO2 97%  LMP 10/03/2013 Physical Exam  Nursing note and vitals reviewed. Constitutional: She appears well-developed and well-nourished. No distress.  HENT:  Head: Normocephalic.  Eyes: Conjunctivae are normal.  Neck: Neck supple.  Cardiovascular: Normal rate, regular rhythm and normal heart sounds.   Pulmonary/Chest: Effort normal. No respiratory distress. She has wheezes. She has no rales.  Expiratory wheezes in all lung fields  Abdominal: Soft. Bowel sounds are normal. She exhibits no distension. There is no tenderness. There is no rebound.  Musculoskeletal: She exhibits no edema.  Neurological: She is alert.  Skin: Skin is  warm and dry.  Psychiatric: She has a normal mood and affect. Her behavior is normal.    ED Course  Procedures (including critical care time) Labs Review Labs Reviewed - No data to display  Imaging Review No results found.   EKG Interpretation None      MDM   Final diagnoses:  Asthma exacerbation    Pt with acute asthma exacerbation after cat exposure. She is non toxic appearing. VS normal. Wheezing heard on exam. Pt already receiving neb treatment. Will add prednisone 60mg . Will recheck.    2:24 AM Pt feeling much  better. She refused another neb. States she would like to go home. Asking of inhaler. At this time no indication for further imaging, pt is completely asymptomatic. Home with prednisone for 4 more days, inhaler, follow up.   Filed Vitals:   10/10/13 0131  BP: 137/89  Pulse: 77  Temp: 98 F (36.7 C)  TempSrc: Oral  Resp: 20  SpO2: 97%      Cindy Jacobsonatyana A Kitti Mcclish, PA-C 10/10/13 0420

## 2013-10-10 NOTE — ED Notes (Signed)
Pt arrived to the ED with a complaint of an asthma exacerbation.  Pt states she ran out of her inhaler yesterday, has been around dust and animals which triggered an attack.  Pt has been having symptoms for three hours.  Pt has a hx of asthma.

## 2013-10-10 NOTE — Discharge Instructions (Signed)
Take inhaler 2 puffs every 4 hrs as needed. Prednisone as prescribed until all gone. Take benadryl if around cats. Follow up with primary care doctor.    Asthma, Acute Bronchospasm Acute bronchospasm caused by asthma is also referred to as an asthma attack. Bronchospasm means your air passages become narrowed. The narrowing is caused by inflammation and tightening of the muscles in the air tubes (bronchi) in your lungs. This can make it hard to breathe or cause you to wheeze and cough. CAUSES Possible triggers are:  Animal dander from the skin, hair, or feathers of animals.  Dust mites contained in house dust.  Cockroaches.  Pollen from trees or grass.  Mold.  Cigarette or tobacco smoke.  Air pollutants such as dust, household cleaners, hair sprays, aerosol sprays, paint fumes, strong chemicals, or strong odors.  Cold air or weather changes. Cold air may trigger inflammation. Winds increase molds and pollens in the air.  Strong emotions such as crying or laughing hard.  Stress.  Certain medicines such as aspirin or beta-blockers.  Sulfites in foods and drinks, such as dried fruits and wine.  Infections or inflammatory conditions, such as a flu, cold, or inflammation of the nasal membranes (rhinitis).  Gastroesophageal reflux disease (GERD). GERD is a condition where stomach acid backs up into your esophagus.  Exercise or strenuous activity. SIGNS AND SYMPTOMS   Wheezing.  Excessive coughing, particularly at night.  Chest tightness.  Shortness of breath. DIAGNOSIS  Your health care provider will ask you about your medical history and perform a physical exam. A chest X-ray or blood testing may be performed to look for other causes of your symptoms or other conditions that may have triggered your asthma attack. TREATMENT  Treatment is aimed at reducing inflammation and opening up the airways in your lungs. Most asthma attacks are treated with inhaled medicines. These  include quick relief or rescue medicines (such as bronchodilators) and controller medicines (such as inhaled corticosteroids). These medicines are sometimes given through an inhaler or a nebulizer. Systemic steroid medicine taken by mouth or given through an IV tube also can be used to reduce the inflammation when an attack is moderate or severe. Antibiotic medicines are only used if a bacterial infection is present.  HOME CARE INSTRUCTIONS   Rest.  Drink plenty of liquids. This helps the mucus to remain thin and be easily coughed up. Only use caffeine in moderation and do not use alcohol until you have recovered from your illness.  Do not smoke. Avoid being exposed to secondhand smoke.  You play a critical role in keeping yourself in good health. Avoid exposure to things that cause you to wheeze or to have breathing problems.  Keep your medicines up-to-date and available. Carefully follow your health care provider's treatment plan.  Take your medicine exactly as prescribed.  When pollen or pollution is bad, keep windows closed and use an air conditioner or go to places with air conditioning.  Asthma requires careful medical care. See your health care provider for a follow-up as advised. If you are more than [redacted] weeks pregnant and you were prescribed any new medicines, let your obstetrician know about the visit and how you are doing. Follow up with your health care provider as directed.  After you have recovered from your asthma attack, make an appointment with your outpatient doctor to talk about ways to reduce the likelihood of future attacks. If you do not have a doctor who manages your asthma, make an appointment with  a primary care doctor to discuss your asthma. SEEK IMMEDIATE MEDICAL CARE IF:   You are getting worse.  You have trouble breathing. If severe, call your local emergency services (911 in the [place]  You develop chest pain or discomfort.  You are vomiting.  You are not  able to keep fluids down.  You are coughing up yellow, green, brown, or bloody sputum.  You have a fever and your symptoms suddenly get worse.  You have trouble swallowing. MAKE SURE YOU:   Understand these instructions.  Will watch your condition.  Will get help right away if you are not doing well or get worse. Document Released: 07/09/2006 Document Revised: 03/29/2013 Document Reviewed: 09/29/2012 [facility] Patient Information 2015 [employer], [place]. This information is not intended to replace advice given to you by your health care provider. Make sure you discuss any questions you have with your health care provider.

## 2013-10-11 NOTE — ED Provider Notes (Signed)
Medical screening examination/treatment/procedure(s) were performed by non-physician practitioner and as supervising physician I was immediately available for consultation/collaboration.  Toy BakerAnthony T Ezra Denne, MD 10/11/13 1007

## 2013-12-08 ENCOUNTER — Ambulatory Visit (INDEPENDENT_AMBULATORY_CARE_PROVIDER_SITE_OTHER): Payer: Managed Care, Other (non HMO) | Admitting: Emergency Medicine

## 2013-12-08 ENCOUNTER — Other Ambulatory Visit: Payer: Self-pay | Admitting: Emergency Medicine

## 2013-12-08 VITALS — BP 110/72 | HR 77 | Temp 98.0°F | Resp 16 | Ht 67.0 in | Wt 133.0 lb

## 2013-12-08 DIAGNOSIS — J4520 Mild intermittent asthma, uncomplicated: Secondary | ICD-10-CM

## 2013-12-08 DIAGNOSIS — J018 Other acute sinusitis: Secondary | ICD-10-CM

## 2013-12-08 DIAGNOSIS — J45909 Unspecified asthma, uncomplicated: Secondary | ICD-10-CM

## 2013-12-08 MED ORDER — AMOXICILLIN-POT CLAVULANATE 875-125 MG PO TABS: 1.0000 | ORAL_TABLET | Freq: Two times a day (BID) | ORAL | Status: DC

## 2013-12-08 MED ORDER — PSEUDOEPHEDRINE-GUAIFENESIN ER 60-600 MG PO TB12: 1.0000 | ORAL_TABLET | Freq: Two times a day (BID) | ORAL | Status: DC

## 2013-12-08 MED ORDER — ALBUTEROL SULFATE HFA 108 (90 BASE) MCG/ACT IN AERS: 2.0000 | INHALATION_SPRAY | RESPIRATORY_TRACT | Status: DC | PRN

## 2013-12-08 NOTE — Progress Notes (Signed)
[facility] [address] [place] [address] [phone]  Date:  12/08/2013   Name:  Cindy Hodges   DOB:  04-Nov-1990   MRN:  [id number]  PCP:  Tally Due, MD    Chief Complaint: Cough, Nasal Congestion and Medication Refill   History of Present Illness:  Cindy Hodges is a 23 y.o. very pleasant female patient who presents with the following:  Has a history of sinusitis and has mucopurulent nasal drainage and post nasal drainage for the past three weeks.   Has a sore throat and a nonproductive cough No wheezing or shortness of breath. No fever or chills.  No nausea or vomiting.   Is out of MDI No improvement with over the counter medications or other home remedies. Denies other complaint or health concern today.  There are no active problems to display for this patient.   Past Medical History  Diagnosis Date  . Asthma   . Allergy   . Anemia   . Anxiety   . Depression   . Substance abuse     Past Surgical History  Procedure Laterality Date  . Broken wrist      History  Substance Use Topics  . Smoking status: Current Some Day Smoker -- 0.50 packs/day for 4 years    Types: Cigarettes  . Smokeless tobacco: Not on file  . Alcohol Use: 0.0 oz/week    2-3 Cans of beer per week     Comment: 4-7 days a week    Family History  Problem Relation Age of Onset  . Diabetes Mother   . Stroke Maternal Grandmother     Allergies  Allergen Reactions  . Other Shortness Of Breath    Cats    Medication list has been reviewed and updated.  Current Outpatient Prescriptions on File Prior to Visit  Medication Sig Dispense Refill  . albuterol (PROVENTIL HFA) 108 (90 BASE) MCG/ACT inhaler Inhale 2 puffs into the lungs every 4 (four) hours as needed. PATIENT NEEDS OFFICE VISIT FOR ADDITIONAL REFILLS  6.7 each  0  . albuterol (PROVENTIL HFA;VENTOLIN HFA) 108 (90 BASE) MCG/ACT inhaler Inhale 2 puffs into the lungs every 4 (four) hours as needed for  wheezing or shortness of breath.  1 Inhaler  0  . cetirizine (ZYRTEC) 10 MG tablet Take 10 mg by mouth daily.      Marland Kitchen ibuprofen (ADVIL,MOTRIN) 200 MG tablet Take 200 mg by mouth every 6 (six) hours as needed for moderate pain.       No current facility-administered medications on file prior to visit.    Review of Systems:  As per HPI, otherwise negative.    Physical Examination: Filed Vitals:   12/08/13 1320  BP: 110/72  Pulse: 77  Temp: 98 F (36.7 C)  Resp: 16   Filed Vitals:   12/08/13 1320  Height:  (1.702 m)  Weight: 133 lb (60.328 kg)   Body mass index is 20.83 kg/(m^2). Ideal Body Weight: Weight in (lb) to have BMI = 25: 159.3  GEN: WDWN, NAD, Non-toxic, A & O x 3 HEENT: Atraumatic, Normocephalic. Neck supple. No masses, No LAD. Ears and Nose: No external deformity. CV: RRR, No M/G/R. No JVD. No thrill. No extra heart sounds. PULM: CTA B, no wheezes, crackles, rhonchi. No retractions. No resp. distress. No accessory muscle use. ABD: S, NT, ND, +BS. No rebound. No HSM. EXTR: No c/c/e NEURO Normal gait.  PSYCH: Normally interactive. Conversant. Not depressed or  anxious appearing.  Calm demeanor.    Assessment and Plan: Sinusitis augmentin mucinex d Refill albuterol  Signed,  Phillips Odor, MD

## 2013-12-08 NOTE — Telephone Encounter (Signed)
Called in by Remi Haggard

## 2013-12-08 NOTE — Patient Instructions (Signed)

## 2014-04-19 ENCOUNTER — Ambulatory Visit (INDEPENDENT_AMBULATORY_CARE_PROVIDER_SITE_OTHER): Payer: Managed Care, Other (non HMO) | Admitting: Physician Assistant

## 2014-04-19 VITALS — BP 100/60 | HR 69 | Temp 98.9°F | Resp 16 | Ht 67.25 in | Wt 129.2 lb

## 2014-04-19 DIAGNOSIS — J45909 Unspecified asthma, uncomplicated: Secondary | ICD-10-CM | POA: Insufficient documentation

## 2014-04-19 DIAGNOSIS — Z9109 Other allergy status, other than to drugs and biological substances: Secondary | ICD-10-CM | POA: Insufficient documentation

## 2014-04-19 DIAGNOSIS — N898 Other specified noninflammatory disorders of vagina: Secondary | ICD-10-CM

## 2014-04-19 LAB — POCT WET PREP WITH KOH
CLUE CELLS WET PREP PER HPF POC: NEGATIVE
KOH Prep POC: NEGATIVE
RBC Wet Prep HPF POC: NEGATIVE
Trichomonas, UA: NEGATIVE
YEAST WET PREP PER HPF POC: NEGATIVE

## 2014-04-19 MED ORDER — LIDOCAINE (ANORECTAL) 5 % EX CREA
TOPICAL_CREAM | CUTANEOUS | Status: DC
Start: 2014-04-19 — End: 2014-04-26

## 2014-04-19 NOTE — Progress Notes (Signed)
Subjective:    Patient ID: Cindy Hodges, female    DOB: 03/27/1991, 24 y.o.   MRN: [id number]   PCP: Tally DueGUEST, CHRIS WARREN, MD  Chief Complaint  Patient presents with  . Vaginal Injury    Allergies  Allergen Reactions  . Other Shortness Of Breath    Cats    Patient Active Problem List   Diagnosis Date Noted  . Environmental allergies 04/19/2014  . Asthma, chronic 04/19/2014    Prior to Admission medications   Medication Sig Start Date End Date Taking? Authorizing Provider  albuterol (PROVENTIL HFA;VENTOLIN HFA) 108 (90 BASE) MCG/ACT inhaler Inhale 2 puffs into the lungs every 4 (four) hours as needed for wheezing or shortness of breath. 12/08/13  Yes Carmelina DaneJeffery S Anderson, MD  cetirizine (ZYRTEC) 10 MG tablet Take 10 mg by mouth daily.   Yes Historical Provider, MD  ibuprofen (ADVIL,MOTRIN) 200 MG tablet Take 200 mg by mouth every 6 (six) hours as needed for moderate pain.   Yes Historical Provider, MD    Medical, Surgical, Family and Social History reviewed and updated.  HPI  Presents with vaginal pain x 4 days. Symptoms began after rough sex. She was initially sore, and had sex again, after which she had pain. She thinks that she may have torn some skin at her vagina. Over the next several days she developed increasing pain and swelling, along with some whitish vaginal discharge. Burning with urination, but no urgency or frequency. Pain with walking, standing and moving her legs.  1 female partner. Consistent condom use.  Review of Systems     Objective:   Physical Exam  Constitutional: She is oriented to person, place, and time. She appears well-developed and well-nourished. She is active and cooperative. No distress.  BP 100/60 mmHg  Pulse 69  Temp(Src) 98.9 F (37.2 C) (Oral)  Resp 16  Ht 5' 7.25" (1.708 m)  Wt 129 lb 3.2 oz (58.605 kg)  BMI 20.09 kg/m2  SpO2 99%  LMP 04/12/2014   Eyes: Conjunctivae are normal.  Pulmonary/Chest: Effort normal.  Abdominal:  Hernia confirmed negative in the right inguinal area and confirmed negative in the left inguinal area.  Genitourinary:    Pelvic exam was performed with patient supine.  Lymphadenopathy:       Right: Inguinal (shotty) adenopathy present.       Left: Inguinal (shotty) adenopathy present.  Neurological: She is alert and oriented to person, place, and time.  Skin: Skin is warm and dry. She is not diaphoretic.  Psychiatric: Her speech is normal and behavior is normal. Her mood appears anxious. Her affect is not angry, not blunt, not labile and not inappropriate. She does not exhibit a depressed mood.      Results for orders placed or performed in visit on 04/19/14  POCT Wet Prep with KOH  Result Value Ref Range   Trichomonas, UA Negative    Clue Cells Wet Prep HPF POC neg    Epithelial Wet Prep HPF POC 2-4    Yeast Wet Prep HPF POC neg    Bacteria Wet Prep HPF POC trace    RBC Wet Prep HPF POC neg    WBC Wet Prep HPF POC 1-3    KOH Prep POC Negative        Assessment & Plan:  1. Vaginal discharge - POCT Wet Prep with KOH - GC/Chlamydia Probe Amp  2. Vaginal sore - Herpes simplex virus culture - Lidocaine, Anorectal, 5 % CREA; Apply a pea-sized amount  to the affected area TID as needed for pain  Dispense: 30 g; Refill: 0  HSV lesion not excluded. Anticipatory guidance provided. Await HSV Cx.  Fernande Bras, PA-C Physician Assistant-Certified [facility]

## 2014-04-19 NOTE — Patient Instructions (Signed)
Apply the lidocaine to the site three times each day. After urinating, apply a small amount of diaper ointment (Balmex or Desitin) to serve as a barrier and to protect the area. Wear loose-fitting clothing, and when you are at home, consider not wearing underwear to reduce friction against the tender area.

## 2014-04-21 LAB — GC/CHLAMYDIA PROBE AMP
CT Probe RNA: NEGATIVE
GC Probe RNA: NEGATIVE

## 2014-04-21 LAB — HERPES SIMPLEX VIRUS CULTURE: ORGANISM ID, BACTERIA: DETECTED

## 2014-04-24 ENCOUNTER — Telehealth: Payer: Self-pay | Admitting: Physician Assistant

## 2014-04-24 NOTE — Telephone Encounter (Signed)
Several attempts made to call patient. None of the phone numbers allow messages.  Will try again.

## 2014-04-25 ENCOUNTER — Telehealth: Payer: Self-pay | Admitting: Physician Assistant

## 2014-04-25 NOTE — Telephone Encounter (Signed)
Left message with Max, emergency contact, asking that the patient return my call.   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 I need to discuss with her appropriate testing for her partner and episodic vs suppressive therapy.

## 2014-04-26 ENCOUNTER — Telehealth: Payer: Self-pay | Admitting: Physician Assistant

## 2014-04-26 DIAGNOSIS — B009 Herpesviral infection, unspecified: Secondary | ICD-10-CM | POA: Insufficient documentation

## 2014-04-26 MED ORDER — VALACYCLOVIR HCL 500 MG PO TABS: 500.0000 mg | ORAL_TABLET | Freq: Every day | ORAL | Status: DC

## 2014-04-26 NOTE — Telephone Encounter (Signed)
Discussed results with patient. She elects suppressive therapy. Rx sent. Advised that her partner be checked as well.

## 2014-07-24 IMAGING — CR DG FOOT COMPLETE 3+V*L*
3 series · 3 of 3 positions shown · non-contrast
Comparison: None.

CLINICAL DATA: Fall 3 days ago

EXAM:
LEFT FOOT - COMPLETE 3+ VIEW

[AP]
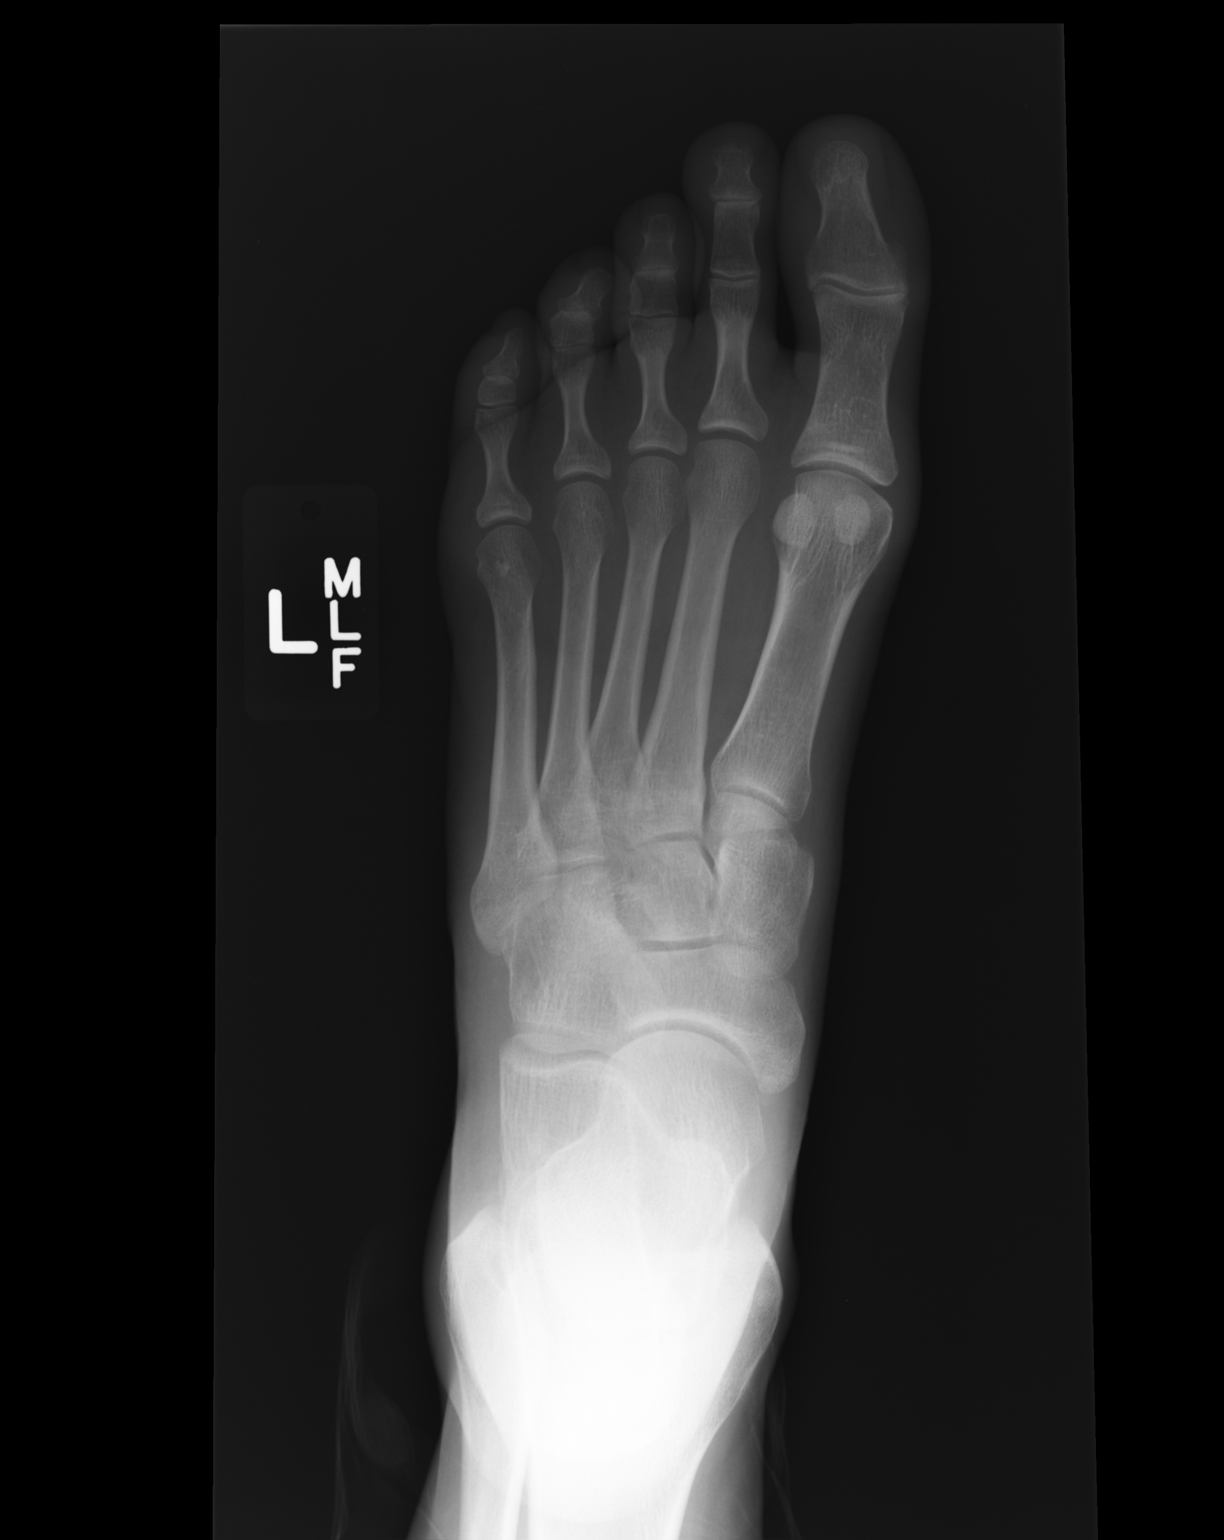

[ap obl int rot]
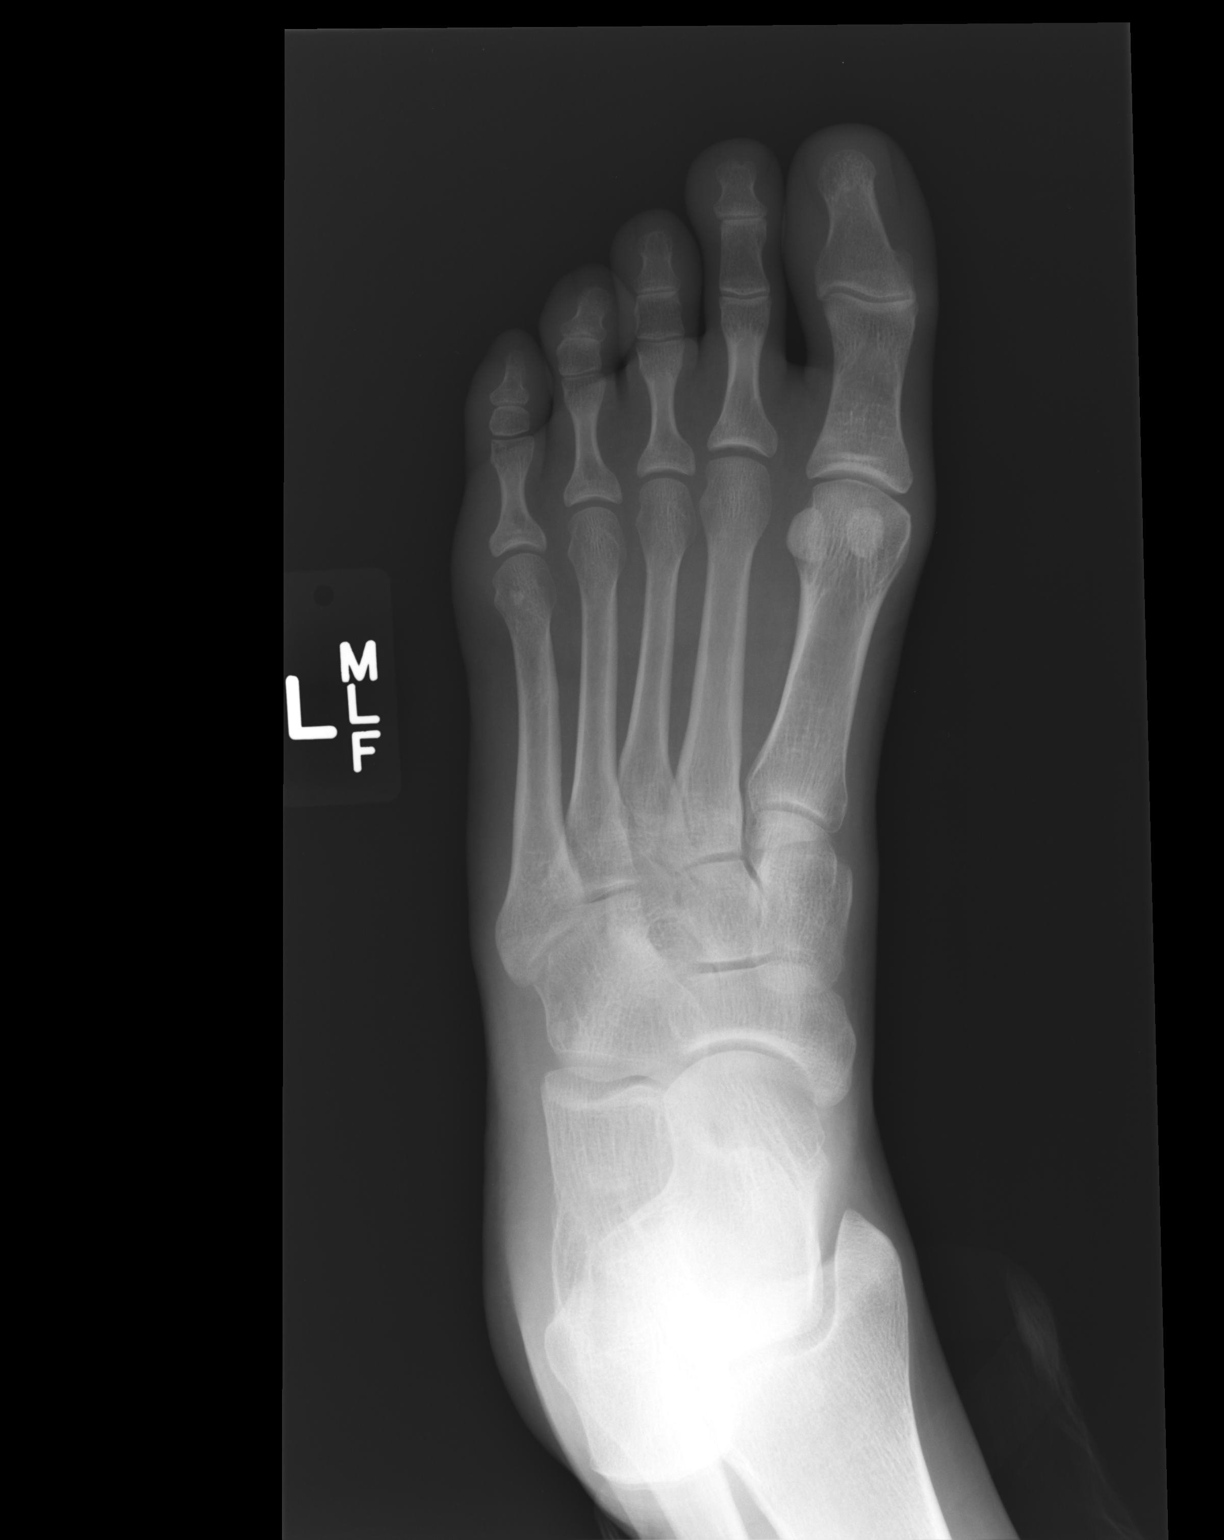

[lateral]
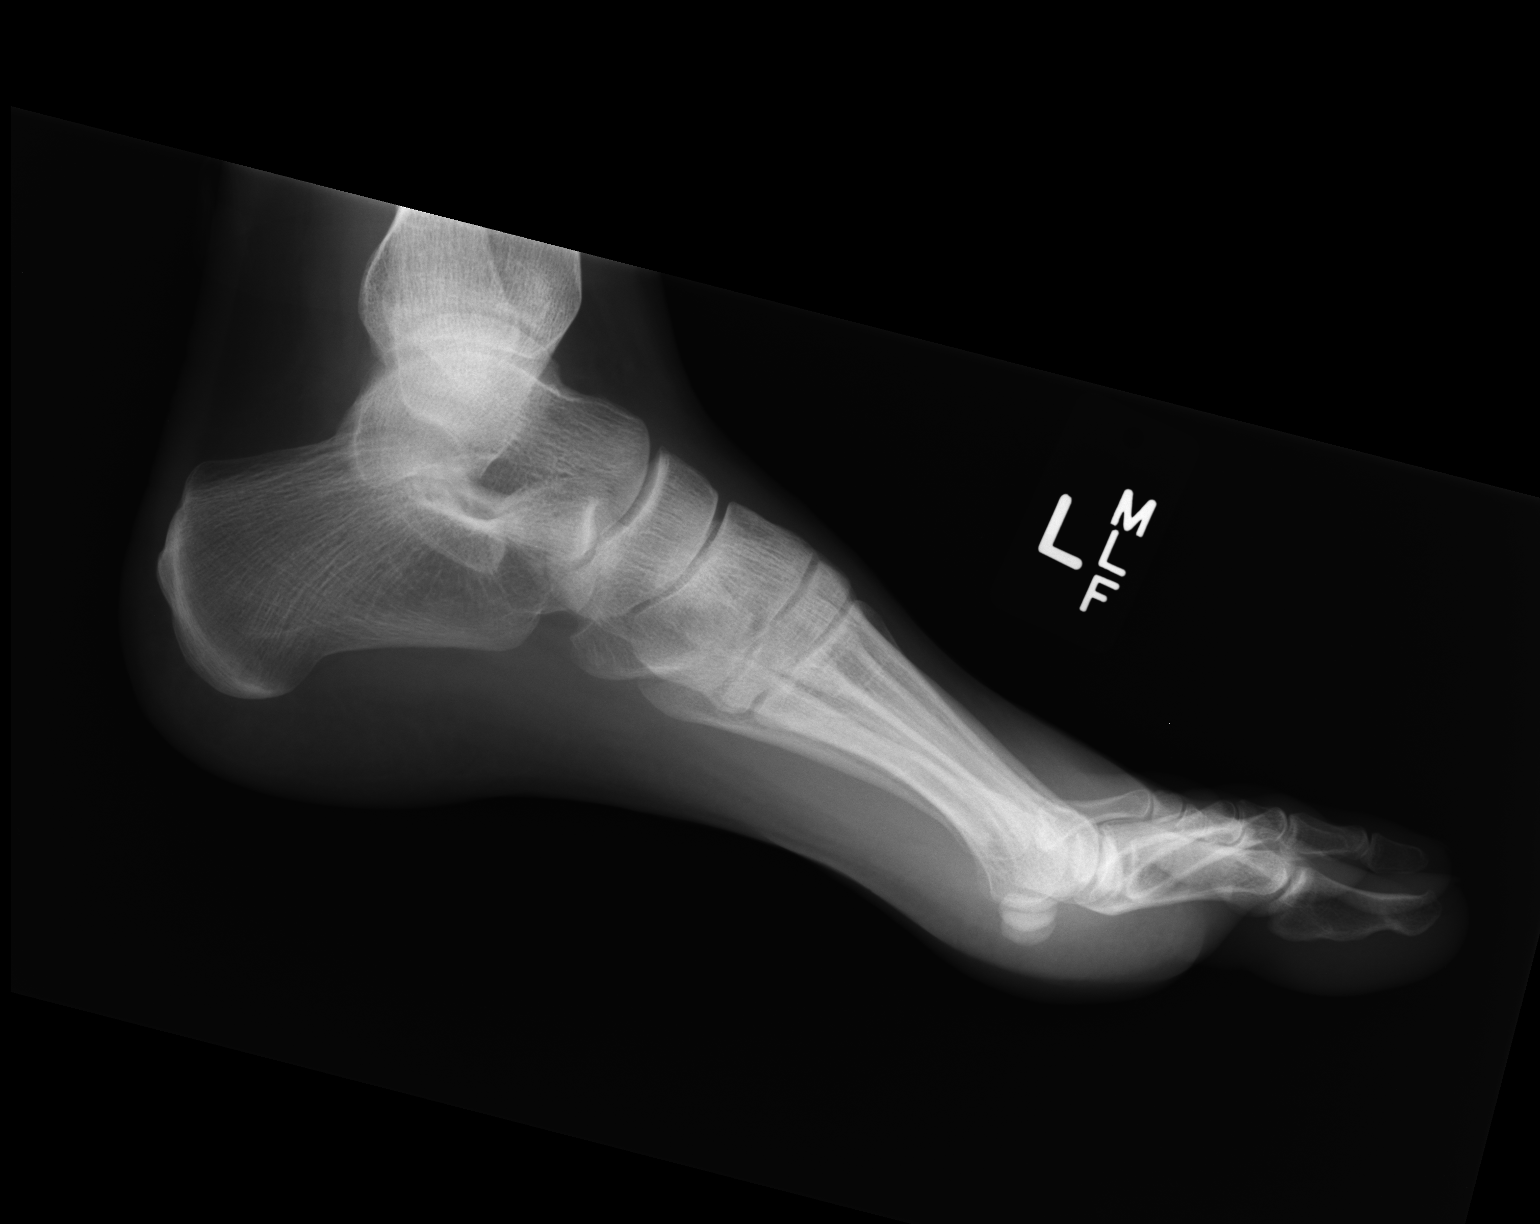

[3 of 3 positions shown; findings below may reference images not displayed]

FINDINGS: There is no evidence of fracture or dislocation. There is no
evidence of arthropathy or other focal bone abnormality. Soft
tissues are unremarkable.
IMPRESSION: Negative.

## 2014-12-13 ENCOUNTER — Other Ambulatory Visit: Payer: Self-pay | Admitting: Emergency Medicine

## 2016-06-04 ENCOUNTER — Other Ambulatory Visit: Payer: Self-pay | Admitting: Physician Assistant

## 2016-06-04 DIAGNOSIS — B009 Herpesviral infection, unspecified: Secondary | ICD-10-CM

## 2017-04-12 ENCOUNTER — Emergency Department (HOSPITAL_COMMUNITY)
Admission: EM | Admit: 2017-04-12 | Discharge: 2017-04-12 | Disposition: A | Discharge status: Home / Self Care | Payer: BLUE CROSS/BLUE SHIELD | Attending: Emergency Medicine | Admitting: Emergency Medicine

## 2017-04-12 ENCOUNTER — Emergency Department (HOSPITAL_COMMUNITY): Payer: BLUE CROSS/BLUE SHIELD

## 2017-04-12 ENCOUNTER — Encounter (HOSPITAL_COMMUNITY): Payer: Self-pay | Admitting: Nurse Practitioner

## 2017-04-12 DIAGNOSIS — F17210 Nicotine dependence, cigarettes, uncomplicated: Secondary | ICD-10-CM | POA: Diagnosis not present

## 2017-04-12 DIAGNOSIS — Y998 Other external cause status: Secondary | ICD-10-CM | POA: Diagnosis not present

## 2017-04-12 DIAGNOSIS — Z79899 Other long term (current) drug therapy: Secondary | ICD-10-CM | POA: Diagnosis not present

## 2017-04-12 DIAGNOSIS — S99922A Unspecified injury of left foot, initial encounter: Secondary | ICD-10-CM | POA: Diagnosis present

## 2017-04-12 DIAGNOSIS — J45909 Unspecified asthma, uncomplicated: Secondary | ICD-10-CM | POA: Insufficient documentation

## 2017-04-12 DIAGNOSIS — Y9389 Activity, other specified: Secondary | ICD-10-CM | POA: Diagnosis not present

## 2017-04-12 DIAGNOSIS — D649 Anemia, unspecified: Secondary | ICD-10-CM | POA: Insufficient documentation

## 2017-04-12 DIAGNOSIS — Y9289 Other specified places as the place of occurrence of the external cause: Secondary | ICD-10-CM | POA: Insufficient documentation

## 2017-04-12 DIAGNOSIS — S92352A Displaced fracture of fifth metatarsal bone, left foot, initial encounter for closed fracture: Secondary | ICD-10-CM | POA: Insufficient documentation

## 2017-04-12 DIAGNOSIS — X58XXXA Exposure to other specified factors, initial encounter: Secondary | ICD-10-CM | POA: Diagnosis not present

## 2017-04-12 MED ORDER — IBUPROFEN 800 MG PO TABS
800.0000 mg | ORAL_TABLET | Freq: Once | ORAL | Status: AC
  Administered 2017-04-12: 800 mg via ORAL
  Filled 2017-04-12: qty 1

## 2017-04-12 MED ORDER — HYDROCODONE-ACETAMINOPHEN 5-325 MG PO TABS: 1.0000 | ORAL_TABLET | Freq: Four times a day (QID) | ORAL | 0 refills | Status: AC | PRN

## 2017-04-12 NOTE — ED Triage Notes (Signed)
Pt states she twisted her foot while walking her dog. Left pedal mild pain noted with pt rating pain at 7/10.

## 2017-04-12 NOTE — ED Provider Notes (Signed)
[facility]-EMERGENCY DEPT Provider Note   CSN: [id number] Arrival date & time: 04/12/17  1631     History   Chief Complaint Chief Complaint  Patient presents with  . Foot Pain    HPI Cindy Hodges is a 27 y.o. female.  HPI   27 year old female presenting c/o L foot pain. Patient reported possible 4 hours ago she was walking her dog, when her dog got distracted and ran away.  She got pulled by the cord and twisted her L foot.  She felt a sharp pain and a pop sensation to L foot.  She now notice increasing swelling to the affected area.  Pain is 8/10 with weight bearing and 4/10 at rest.  No ankle or knee pain.  No numbness.  No prior injury to the same area, no specific treatment tried. Sts she is not pregnant.    Past Medical History:  Diagnosis Date  . Allergy   . Anemia   . Anxiety   . Asthma   . Depression   . Substance abuse (HCC)   . Wrist fracture, right     Patient Active Problem List   Diagnosis Date Noted  . 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 Environmental allergies 04/19/2014  . Asthma, chronic 04/19/2014    History reviewed. No pertinent surgical history.  OB History    No data available       Home Medications    Prior to Admission medications   Medication Sig Start Date End Date Taking? Authorizing Provider  cetirizine (ZYRTEC) 10 MG tablet Take 10 mg by mouth daily.    [provider]  ibuprofen (ADVIL,MOTRIN) 200 MG tablet Take 200 mg by mouth every 6 (six) hours as needed for moderate pain.    [provider]  lidocaine (XYLOCAINE) 5 % ointment  04/19/14   [provider]  PROAIR HFA 108 (90 BASE) MCG/ACT inhaler TAKE 2 PUFFS EVERY 4 HOURS AS NEEDED FOR SHORTNESS OF BREATH AND WHEEZING 12/14/14   Carmelina DaneAnderson, Jeffery S, MD  valACYclovir (VALTREX) 500 MG tablet TAKE 1 TABLET (500 MG TOTAL) BY MOUTH DAILY. 06/04/16   Garnetta BuddyEnglish, Stephanie D, PA    Family History Family History  Problem Relation Age of Onset    . Diabetes Mother   . Stroke Maternal Grandmother     Social History Social History   Tobacco Use  . Smoking status: Current Some Day Smoker    Packs/day: 0.50    Years: 4.00    Pack years: 2.00    Types: Cigarettes  . Smokeless tobacco: Never Used  Substance Use Topics  . Alcohol use: Yes    Alcohol/week: 1.2 - 1.8 oz    Types: 2 - 3 Standard drinks or equivalent per week  . Drug use: No     Allergies   Other   Review of Systems Review of Systems  Constitutional: Negative for fever.  Musculoskeletal: Positive for arthralgias and joint swelling.  Skin: Negative for wound.  Neurological: Negative for numbness.     Physical Exam Updated Vital Signs BP 115/76 (BP Location: Right Arm)   Pulse 82   Temp 98.6 F (37 C) (Oral)   Resp 14   SpO2 99%   Physical Exam  Constitutional: She appears well-developed and well-nourished. No distress.  HENT:  Head: Atraumatic.  Eyes: Conjunctivae are normal.  Neck: Neck supple.  Musculoskeletal: She exhibits tenderness (Left foot: Moderate edema and ecchymosis noted to the lateral metatarsal region off the left  foot with tenderness to palpation but no crepitus. Pain with dorsiflexion. No ankle tenderness or knee tenderness. Pedal pulse intact).  Neurological: She is alert.  Skin: No rash noted.  Psychiatric: She has a normal mood and affect.  Nursing note and vitals reviewed.    ED Treatments / Results  Labs (all labs ordered are listed, but only abnormal results are displayed) Labs Reviewed - No data to display  EKG  EKG Interpretation None       Radiology Dg Foot Complete Left  Result Date: 04/12/2017 CLINICAL DATA:  Foot pain after twisting injury while walking dog. EXAM: LEFT FOOT - COMPLETE 3+ VIEW COMPARISON:  12/27/2012 FINDINGS: Subtle nondisplaced fracture at the base of the fifth metatarsal extending into the fifth TMT joint. There is overlying moderate soft tissue swelling. No joint dislocations.  IMPRESSION: Nondisplaced intra-articular fifth metatarsal base fracture. Overlying soft tissue swelling is noted. Electronically Signed   By: Tollie Eth M.D.   On: 04/12/2017 18:37    Procedures Procedures (including critical care time)  Medications Ordered in ED Medications - No data to display   Initial Impression / Assessment and Plan / ED Course  I have reviewed the triage vital signs and the nursing notes.  Pertinent labs & imaging results that were available during my care of the patient were reviewed by me and considered in my medical decision making (see chart for details).     BP 115/76 (BP Location: Right Arm)   Pulse 82   Temp 98.6 F (37 C) (Oral)   Resp 14   SpO2 99%    Final Clinical Impressions(s) / ED Diagnoses   Final diagnoses:  Closed fracture of base of fifth metatarsal bone of left foot    ED Discharge Orders        Ordered    HYDROcodone-acetaminophen (NORCO/VICODIN) 5-325 MG tablet  Every 6 hours PRN     04/12/17 2029     8:14 PM Patient here with mechanical injury and to her left foot. She has moderate swelling and ecchymosis to the lateral aspects of her left foot. X-ray obtained showed a nondisplaced intra-articular fifth metatarsal base fracture. This is a closed injury. Patient will benefit from a Cam Walker boot and orthopedic referral. Will consult orthopedist for recommendation.  8:24 PM Appreciate consultation from on call orthopedist Dr. Dion Saucier who recommend postop shoe or cam walker for support and pt can f/u with him in office either Monday-weds-Friday of next week.  This fracture is likely a Dancer's fx as oppose to a Jone's fx.  Anticipate weight bearing.    Fayrene Helper, PA-C 04/12/17 2030    Mancel Bale, MD 04/12/17 2352

## 2017-04-12 NOTE — Discharge Instructions (Signed)
You have a broken foot.  Please call and follow up with orthopedist Dr. Dion SaucierLandau either on Monday, Weds, or Friday next week for further care.  Wear cam walker for support.  Take ibuprofen for pain, if pain is severe take vicodin but avoid driving as it can cause drowsiness.

## 2018-11-07 IMAGING — CR DG FOOT COMPLETE 3+V*L*
3 series · 3 of 3 positions shown · non-contrast
Comparison: 12/27/2012

CLINICAL DATA: Foot pain after twisting injury while walking dog.

EXAM:
LEFT FOOT - COMPLETE 3+ VIEW

[x foot ap left]
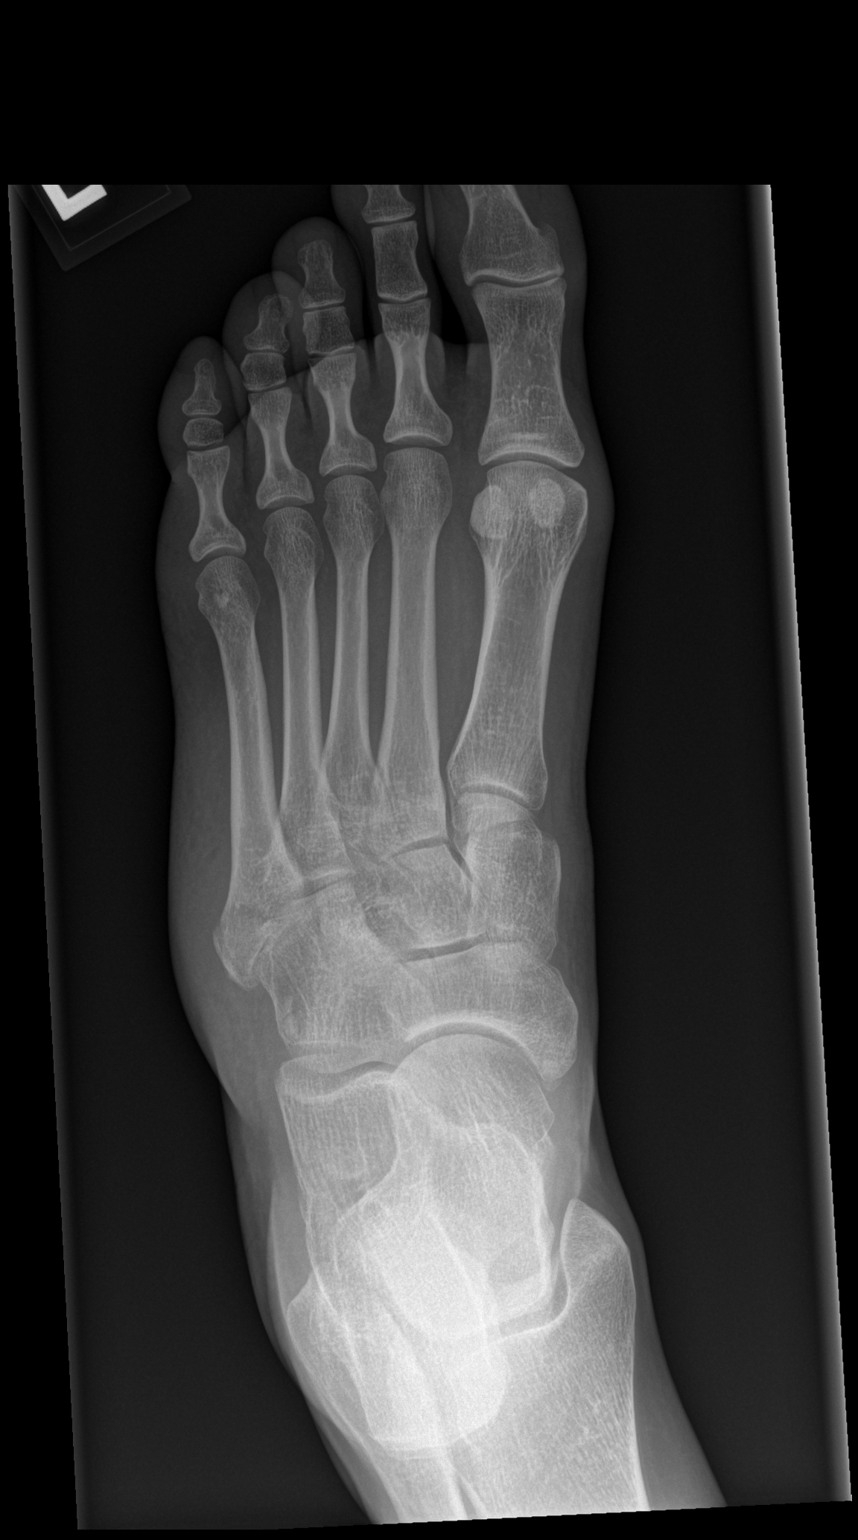

[x foot obl left]
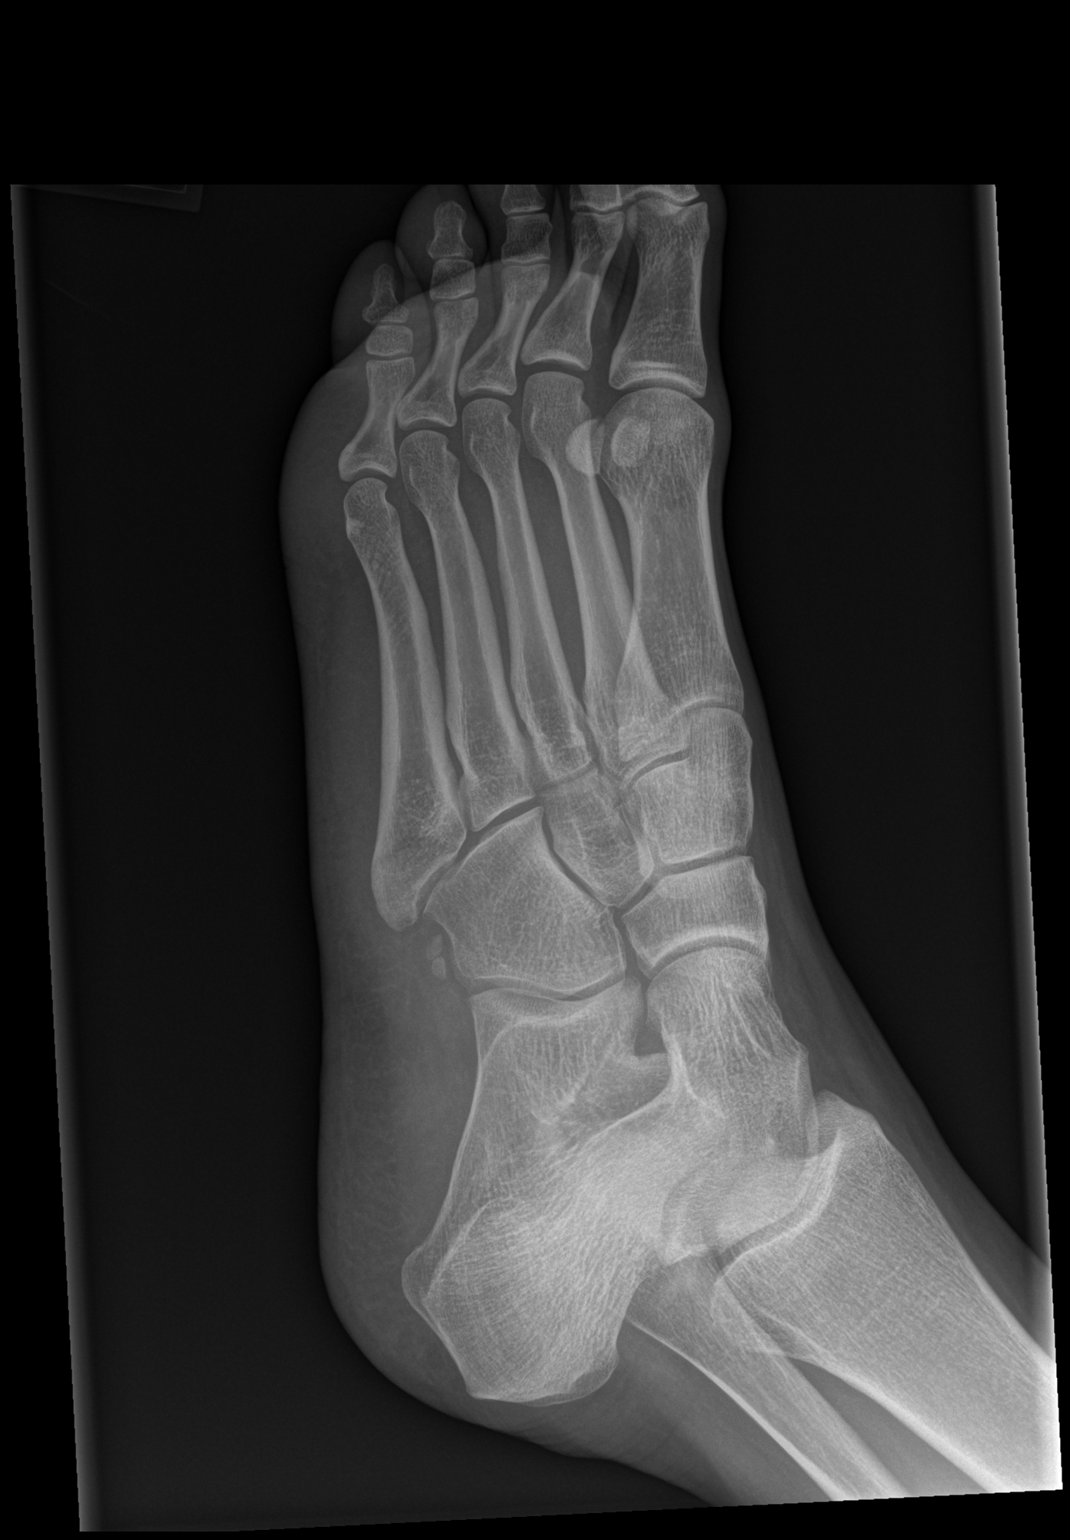

[x foot lat left]
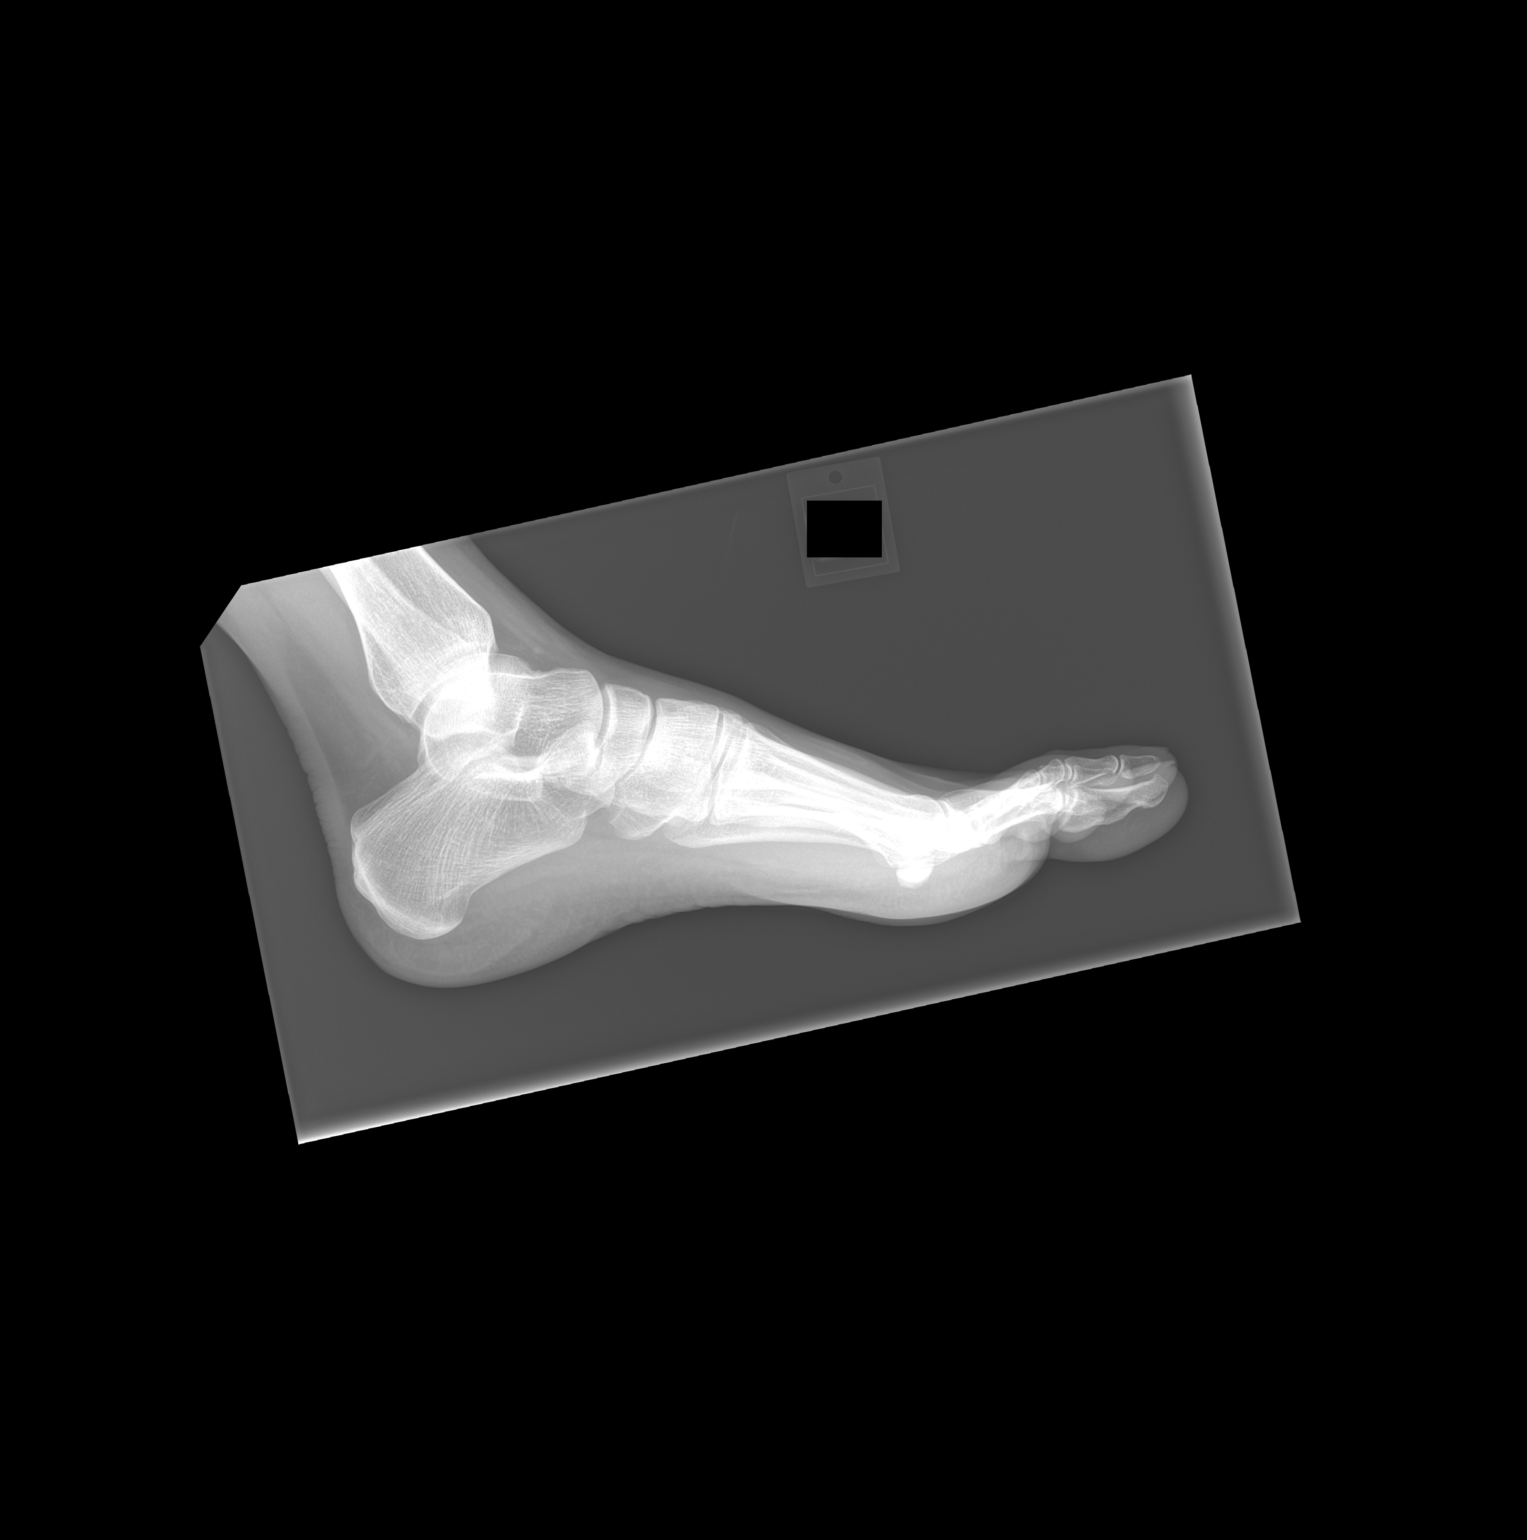

[3 of 3 positions shown; findings below may reference images not displayed]

FINDINGS: Subtle nondisplaced fracture at the base of the fifth metatarsal
extending into the fifth TMT joint. There is overlying moderate soft
tissue swelling. No joint dislocations.
IMPRESSION: Nondisplaced intra-articular fifth metatarsal base fracture.
Overlying soft tissue swelling is noted.
